# Patient Record
Sex: Male | Born: 1935 | Race: White | Hispanic: No | Marital: Single | Smoking: Current every day smoker
Health system: Southern US, Community
[De-identification: ages and names within clinical notes are randomized; demographics above are authoritative.]

## PROBLEM LIST (undated history)

## (undated) DIAGNOSIS — R339 Retention of urine, unspecified: Secondary | ICD-10-CM

## (undated) DIAGNOSIS — R7303 Prediabetes: Secondary | ICD-10-CM

## (undated) DIAGNOSIS — E785 Hyperlipidemia, unspecified: Secondary | ICD-10-CM

## (undated) DIAGNOSIS — I10 Essential (primary) hypertension: Secondary | ICD-10-CM

## (undated) DIAGNOSIS — K922 Gastrointestinal hemorrhage, unspecified: Secondary | ICD-10-CM

## (undated) DIAGNOSIS — E46 Unspecified protein-calorie malnutrition: Secondary | ICD-10-CM

## (undated) DIAGNOSIS — I4891 Unspecified atrial fibrillation: Secondary | ICD-10-CM

## (undated) DIAGNOSIS — K469 Unspecified abdominal hernia without obstruction or gangrene: Secondary | ICD-10-CM

## (undated) DIAGNOSIS — J449 Chronic obstructive pulmonary disease, unspecified: Secondary | ICD-10-CM

## (undated) DIAGNOSIS — C801 Malignant (primary) neoplasm, unspecified: Secondary | ICD-10-CM

## (undated) DIAGNOSIS — I639 Cerebral infarction, unspecified: Secondary | ICD-10-CM

## (undated) DIAGNOSIS — N400 Enlarged prostate without lower urinary tract symptoms: Secondary | ICD-10-CM

## (undated) DIAGNOSIS — I499 Cardiac arrhythmia, unspecified: Secondary | ICD-10-CM

## (undated) DIAGNOSIS — F419 Anxiety disorder, unspecified: Secondary | ICD-10-CM

## (undated) HISTORY — DX: Benign prostatic hyperplasia without lower urinary tract symptoms: N40.0

## (undated) HISTORY — PX: TONSILLECTOMY AND ADENOIDECTOMY: SHX28

## (undated) HISTORY — PX: NO PAST SURGERIES: SHX2092

## (undated) HISTORY — DX: Unspecified protein-calorie malnutrition: E46

## (undated) HISTORY — DX: Anxiety disorder, unspecified: F41.9

## (undated) HISTORY — DX: Retention of urine, unspecified: R33.9

## (undated) HISTORY — DX: Chronic obstructive pulmonary disease, unspecified: J44.9

## (undated) HISTORY — DX: Gastrointestinal hemorrhage, unspecified: K92.2

---

## 2009-09-16 ENCOUNTER — Emergency Department (HOSPITAL_COMMUNITY): Admission: EM | Admit: 2009-09-16 | Discharge: 2009-09-16 | Payer: Self-pay | Admitting: Emergency Medicine

## 2012-06-22 DIAGNOSIS — K922 Gastrointestinal hemorrhage, unspecified: Secondary | ICD-10-CM

## 2012-06-22 HISTORY — DX: Gastrointestinal hemorrhage, unspecified: K92.2

## 2012-06-30 ENCOUNTER — Encounter (HOSPITAL_COMMUNITY): Payer: Self-pay | Admitting: Emergency Medicine

## 2012-06-30 ENCOUNTER — Observation Stay (HOSPITAL_COMMUNITY)
Admission: EM | Admit: 2012-06-30 | Discharge: 2012-07-01 | Disposition: A | Discharge status: Home / Self Care | Payer: No Typology Code available for payment source | Attending: Internal Medicine | Admitting: Internal Medicine

## 2012-06-30 ENCOUNTER — Emergency Department (HOSPITAL_COMMUNITY): Payer: No Typology Code available for payment source

## 2012-06-30 DIAGNOSIS — I10 Essential (primary) hypertension: Secondary | ICD-10-CM

## 2012-06-30 DIAGNOSIS — F411 Generalized anxiety disorder: Secondary | ICD-10-CM

## 2012-06-30 DIAGNOSIS — R338 Other retention of urine: Secondary | ICD-10-CM

## 2012-06-30 DIAGNOSIS — F17200 Nicotine dependence, unspecified, uncomplicated: Secondary | ICD-10-CM | POA: Insufficient documentation

## 2012-06-30 DIAGNOSIS — E785 Hyperlipidemia, unspecified: Secondary | ICD-10-CM | POA: Insufficient documentation

## 2012-06-30 DIAGNOSIS — I4510 Unspecified right bundle-branch block: Secondary | ICD-10-CM | POA: Insufficient documentation

## 2012-06-30 DIAGNOSIS — R7309 Other abnormal glucose: Secondary | ICD-10-CM | POA: Insufficient documentation

## 2012-06-30 DIAGNOSIS — I4891 Unspecified atrial fibrillation: Principal | ICD-10-CM

## 2012-06-30 DIAGNOSIS — K4020 Bilateral inguinal hernia, without obstruction or gangrene, not specified as recurrent: Secondary | ICD-10-CM | POA: Insufficient documentation

## 2012-06-30 DIAGNOSIS — Z79899 Other long term (current) drug therapy: Secondary | ICD-10-CM | POA: Insufficient documentation

## 2012-06-30 DIAGNOSIS — F419 Anxiety disorder, unspecified: Secondary | ICD-10-CM | POA: Diagnosis present

## 2012-06-30 HISTORY — DX: Hyperlipidemia, unspecified: E78.5

## 2012-06-30 HISTORY — DX: Essential (primary) hypertension: I10

## 2012-06-30 HISTORY — DX: Unspecified abdominal hernia without obstruction or gangrene: K46.9

## 2012-06-30 HISTORY — DX: Prediabetes: R73.03

## 2012-06-30 LAB — CBC
MCH: 32 pg (ref 26.0–34.0)
Platelets: 149 10*3/uL — ABNORMAL LOW (ref 150–400)
RBC: 5.28 MIL/uL (ref 4.22–5.81)
RDW: 12.4 % (ref 11.5–15.5)
WBC: 9.5 10*3/uL (ref 4.0–10.5)

## 2012-06-30 LAB — URINALYSIS, ROUTINE W REFLEX MICROSCOPIC
Bilirubin Urine: NEGATIVE
Ketones, ur: NEGATIVE mg/dL
Leukocytes, UA: NEGATIVE
Nitrite: NEGATIVE
Urobilinogen, UA: 1 mg/dL (ref 0.0–1.0)
pH: 6 (ref 5.0–8.0)

## 2012-06-30 LAB — BASIC METABOLIC PANEL
Calcium: 9.7 mg/dL (ref 8.4–10.5)
Creatinine, Ser: 1.26 mg/dL (ref 0.50–1.35)
GFR calc non Af Amer: 52 mL/min — ABNORMAL LOW (ref >90)
Glucose, Bld: 122 mg/dL — ABNORMAL HIGH (ref 70–99)
Sodium: 140 mEq/L (ref 135–145)

## 2012-06-30 LAB — APTT: aPTT: 30 seconds (ref 24–37)

## 2012-06-30 MED ORDER — DILTIAZEM HCL 60 MG PO TABS
60.0000 mg | ORAL_TABLET | Freq: Once | ORAL | Status: AC
  Administered 2012-06-30: 60 mg via ORAL
  Filled 2012-06-30: qty 1

## 2012-06-30 MED ORDER — SIMVASTATIN 5 MG PO TABS: 5.0000 mg | ORAL_TABLET | Freq: Every day | ORAL | Status: DC

## 2012-06-30 MED ORDER — PRAVASTATIN SODIUM 10 MG PO TABS
10.0000 mg | ORAL_TABLET | Freq: Every day | ORAL | Status: DC
  Filled 2012-06-30: qty 1

## 2012-06-30 MED ORDER — NICOTINE 14 MG/24HR TD PT24
14.0000 mg | MEDICATED_PATCH | Freq: Every day | TRANSDERMAL | Status: DC
  Administered 2012-06-30 – 2012-07-01 (×2): 14 mg via TRANSDERMAL
  Filled 2012-06-30 (×2): qty 1

## 2012-06-30 MED ORDER — ACETAMINOPHEN 325 MG PO TABS: 650.0000 mg | ORAL_TABLET | Freq: Four times a day (QID) | ORAL | Status: DC | PRN

## 2012-06-30 MED ORDER — ONDANSETRON HCL 4 MG/2ML IJ SOLN
4.0000 mg | Freq: Once | INTRAMUSCULAR | Status: AC
  Administered 2012-06-30: 4 mg via INTRAVENOUS
  Filled 2012-06-30: qty 2

## 2012-06-30 MED ORDER — ALPRAZOLAM 0.5 MG PO TABS
0.5000 mg | ORAL_TABLET | Freq: Three times a day (TID) | ORAL | Status: DC | PRN
  Administered 2012-06-30 – 2012-07-01 (×2): 0.5 mg via ORAL
  Filled 2012-06-30 (×2): qty 1

## 2012-06-30 MED ORDER — DILTIAZEM LOAD VIA INFUSION
5.0000 mg | Freq: Once | INTRAVENOUS | Status: DC
  Filled 2012-06-30: qty 5

## 2012-06-30 MED ORDER — SODIUM CHLORIDE 0.9 % IV SOLN
INTRAVENOUS | Status: AC
  Administered 2012-06-30: 19:00:00 via INTRAVENOUS

## 2012-06-30 MED ORDER — RIVAROXABAN 15 MG PO TABS
15.0000 mg | ORAL_TABLET | Freq: Every day | ORAL | Status: DC
  Administered 2012-06-30: 15 mg via ORAL
  Filled 2012-06-30 (×2): qty 1

## 2012-06-30 MED ORDER — SIMVASTATIN 5 MG PO TABS
5.0000 mg | ORAL_TABLET | Freq: Every day | ORAL | Status: DC
  Administered 2012-06-30: 5 mg via ORAL
  Filled 2012-06-30 (×2): qty 1

## 2012-06-30 MED ORDER — SODIUM CHLORIDE 0.9 % IJ SOLN
3.0000 mL | Freq: Two times a day (BID) | INTRAMUSCULAR | Status: DC
  Administered 2012-06-30 – 2012-07-01 (×2): 3 mL via INTRAVENOUS

## 2012-06-30 MED ORDER — ALPRAZOLAM 1 MG PO TABS
1.0000 mg | ORAL_TABLET | Freq: Once | ORAL | Status: AC
  Administered 2012-06-30: 1 mg via ORAL
  Filled 2012-06-30: qty 1

## 2012-06-30 MED ORDER — ENOXAPARIN SODIUM 40 MG/0.4ML ~~LOC~~ SOLN
40.0000 mg | SUBCUTANEOUS | Status: DC
  Filled 2012-06-30: qty 0.4

## 2012-06-30 MED ORDER — DILTIAZEM HCL 30 MG PO TABS
30.0000 mg | ORAL_TABLET | Freq: Four times a day (QID) | ORAL | Status: DC
  Administered 2012-06-30: 30 mg via ORAL
  Filled 2012-06-30 (×7): qty 1

## 2012-06-30 MED ORDER — OXYCODONE HCL 5 MG PO TABS
5.0000 mg | ORAL_TABLET | ORAL | Status: DC | PRN
  Administered 2012-06-30: 5 mg via ORAL
  Filled 2012-06-30: qty 1

## 2012-06-30 MED ORDER — DILTIAZEM HCL 25 MG/5ML IV SOLN: 5.0000 mg | Freq: Once | INTRAVENOUS | Status: DC

## 2012-06-30 MED ORDER — ONDANSETRON HCL 4 MG PO TABS: 4.0000 mg | ORAL_TABLET | Freq: Four times a day (QID) | ORAL | Status: DC | PRN

## 2012-06-30 MED ORDER — ONDANSETRON HCL 4 MG/2ML IJ SOLN
4.0000 mg | Freq: Four times a day (QID) | INTRAMUSCULAR | Status: DC | PRN
  Administered 2012-06-30 – 2012-07-01 (×2): 4 mg via INTRAVENOUS
  Filled 2012-06-30 (×2): qty 2

## 2012-06-30 MED ORDER — DEXTROSE 5 % IV SOLN
5.0000 mg/h | INTRAVENOUS | Status: DC
  Filled 2012-06-30: qty 100

## 2012-06-30 MED ORDER — ACETAMINOPHEN 650 MG RE SUPP: 650.0000 mg | Freq: Four times a day (QID) | RECTAL | Status: DC | PRN

## 2012-06-30 NOTE — Progress Notes (Signed)
   CARE MANAGEMENT ED NOTE 06/30/2012  Patient:  ZION, TA   Account Number:  [id number]  Date Initiated:  06/30/2012  Documentation initiated by:    Subjective/Objective Assessment:   Patient presented to ED with difficulty urinating     Subjective/Objective Assessment Detail:     Action/Plan:   Action/Plan Detail:   Anticipated DC Date:       Status Recommendation to Physician:   Result of Recommendation:    Other ED Services  Consult Working Plan    DC Planning Services  Other  PCP issues    Choice offered to / List presented to:            Status of service:  Completed, signed off  ED Comments:   ED Comments Detail:  Patient listed as not having a PCP.  EDCM went to speak with patient.  Physician in with patient at this time.  As per patient's RN, poatient's PCP is Dr. Catha Gosselin of [facility].  No further needs at this time.

## 2012-06-30 NOTE — Consult Note (Signed)
CARDIOLOGY CONSULT NOTE  Patient ID: Shannon Kirby, MRN: [id number], DOB/AGE: April 20, 1931 79 y.o. Admit date: 06/30/2012   Date of Consult: 06/30/2012 Primary Physician: Mickie Hillier, MD Primary Cardiologist: New to [facility], being seen by Dr. Patty Sermons  Chief Complaint: can't urinate Reason for Consult: newly recognized atrial fibrillation  HPI: Shannon Kirby is a 79 y/o M with no prior cardiac history but a history of HTN, longstanding tobacco abuse, and pre-diabetes who presented to [facility] with complaints of symptoms of urinary retention for the last 3-4 days. He has been having trouble with slow stream, difficulty initiating stream, and dribbling. He denies dysuria or hematuria. This morning he had abdominal fullness and nausea thus came to the ER. Foley was inserted with -700 straw colored urine. He also reported some lightheadedness that improved with laying down, thus was placed on the monitor which showed afib 95-105. He received 60mg  oral diltiazem with improvement in HR to the 80's. He denies any awareness of his arrhythmia including CP, SOB or palpitations. No syncope or history of falls/unsteadiness. He denies a history of stroke, mini-stroke, or prior bleeding problems. He has lost about 3 lbs in several months unintentionally. He currently denies any symptoms at all.  Past Medical History  Diagnosis Date  . Hypertension   . Hernia     L groin   . Hyperlipidemia   . Pre-diabetes       Most Recent Cardiac Studies: none   Surgical History:  Past Surgical History  Procedure Laterality Date  . No past surgeries       Home Meds: Prior to Admission medications   Medication Sig Start Date End Date Taking? Authorizing Provider  ALPRAZolam Prudy Feeler) 1 MG tablet Take 0.5 mg by mouth 4 (four) times daily as needed for sleep.   Yes Historical Provider, MD  amLODipine (NORVASC) 10 MG tablet Take 10 mg by mouth every morning.   Yes Historical Provider, MD  metoprolol succinate (TOPROL-XL) 25 MG 24 hr  tablet Take 25 mg by mouth every morning.   Yes Historical Provider, MD  pravastatin (PRAVACHOL) 10 MG tablet Take 10 mg by mouth every evening.   Yes Historical Provider, MD    Inpatient Medications:  . diltiazem  30 mg Oral Q6H  . enoxaparin (LOVENOX) injection  40 mg Subcutaneous Q24H  . nicotine  14 mg Transdermal Daily  . simvastatin  5 mg Oral q1800  . sodium chloride  3 mL Intravenous Q12H   . sodium chloride      Allergies: No Known Allergies  History   Social History  . Marital Status: Single    Spouse Name: N/A    Number of Children: N/A  . Years of Education: N/A   Occupational History  . Not on file.   Social History Main Topics  . Smoking status: Current Every Day Smoker -- 1.00 packs/day for 63 years  . Smokeless tobacco: Never Used     Comment: Smoked since age 79  . Alcohol Use: No  . Drug Use: No  . Sexually Active: Not on file   Other Topics Concern  . Not on file   Social History Narrative  . No narrative on file     Family History  Problem Relation Age of Onset  . Other Mother     Passed at 8 of "blood clot in chest"  . Colon cancer Father     Passed at 28  . Heart attack Sister     Passed away 28  Review of Systems: General: negative for chills, fever, night sweats Cardiovascular: negative for edema, orthopnea, palpitations, paroxysmal nocturnal dyspnea, shortness of breath or dyspnea on exertion Dermatological: negative for rash Respiratory: negative for cough or wheezing Urologic: negative for hematuria Abdominal: negative for nausea, vomiting, diarrhea, bright red blood per rectum, melena, or hematemesis Neurologic: negative for visual changes, syncope All other systems reviewed and are otherwise negative except as noted above.  Labs: Troponin neg x 1 POC Lab Results  Component Value Date   WBC 9.5 06/30/2012   HGB 16.9 06/30/2012   HCT 48.8 06/30/2012   MCV 92.4 06/30/2012   PLT 149* 06/30/2012    Recent Labs Lab  06/30/12 1037  NA 140  K 4.9  CL 102  CO2 29  BUN 14  CREATININE 1.26  CALCIUM 9.7  GLUCOSE 122*    Radiology/Studies:  Dg Chest Port 1 View 06/30/2012   *RADIOLOGY REPORT*  Clinical Data: Dysuria, difficulty urinating, dizziness, history asthma, hypertension  PORTABLE CHEST - 1 VIEW  Comparison: Portable exam 1025 hours compared to 11/09/2011  Findings: Normal heart size, mediastinal contours, and pulmonary vascularity. Skin folds project over chest bilaterally. Emphysematous and bronchitic changes consistent with COPD. Minimal atelectasis at right base. No acute infiltrate, pleural effusion or pneumothorax. Bones appear demineralized.  IMPRESSION: COPD changes with minimal right basilar atelectasis. No acute infiltrate.   Original Report Authenticated By: Ulyses Southward, M.D.    EKG: atrial fibrillation 104bpm atypical RBBBB, nonspecific ST-T changes  Physical Exam: Blood pressure 122/79, pulse 90, temperature 97 F (36.1 C), temperature source Oral, resp. rate 19, height 6\' 3"  (1.905 m), weight 133 lb (60.328 kg), SpO2 98.00%. General: Well developed, well nourished thin WM in no acute distress. Head: Normocephalic, atraumatic, sclera non-icteric, no xanthomas, nares are without discharge.  Neck: Negative for carotid bruits. JVD not elevated. Lungs: Clear bilaterally to auscultation without wheezes, rales, or rhonchi. Breathing is unlabored. Heart: Irregularly irregular, controlled rate, with S1 S2. No murmurs, rubs, or gallops appreciated. Abdomen: Soft, non-tender, non-distended with normoactive bowel sounds. No hepatomegaly. No rebound/guarding. No obvious abdominal masses. Msk:  Strength and tone appear normal for age. Extremities: No clubbing or cyanosis. No edema.  Distal pedal pulses are 2+ and equal bilaterally. Neuro: Alert and oriented X 3. No facial asymmetry. No focal deficit. Moves all extremities spontaneously. Psych:  Responds to questions appropriately with a normal affect.    Assessment and Plan:  1. Urinary retention - per IM. PSA is also pending (has noted 3lb weight loss). 2. Newly recognized atrial fibillation - duration unknown. He is relatively asymptomatic. CHA2DS2VASC preliminarily is 3 for age/HTN, but may have +1 for diabetes if A1C is abnormal. There are no contraindications to anticoagulation identified at this time. We will start Xarelto 15mg  qsupper (CrCl 44ml/min). If renal function improves, may need to adjust dose up to 20mg . Agree with 2D echo, TSH, diltiazem 30mg  po q6hr as ordered. 3. Renal insufficiency - follow Cr s/p foley.  4. HTN - follow BP. 5. Hyperglycemia -check A1C. 6. Tobacco abuse - counseled regarding cessation. He is not interested in quitting at this time.  Signed, Ronie Spies PA-C 06/30/2012, 4:17 PM Patient seen with Ronie Spies, PA-C. He has been totally unaware of his heart rhythm.  He has had occasional mild dizziness in the last several weeks which may or may not be related to his atrial fibrillation. Denies any chest pain.  No TIA or stroke symptoms. Has smoked since age 10. His exam is consistent  with COPD with barrel chest and distant breath sounds. Heart reveals distant heart tones with no murmur noted. 2D echo is pending. I talked with him about long term anticoagulation to prevent systemic emboli and he and his family are agreeable. No contra-indication to anticoagulation.  Agree with assessment and plan above.

## 2012-06-30 NOTE — ED Notes (Signed)
Pt states difficulty urinating since Friday, states feels he needs to go but unable, states he doesn't know his prostate has ever been checked, also c/o dizziness, feels he is going to pass out, EMS states heart rate irregular, pt on monitor shows A fib, pt never told he has had irregular heart rhythm,

## 2012-06-30 NOTE — ED Provider Notes (Signed)
History     CSN: [id number]  Arrival date & time 06/30/12  4098   First MD Initiated Contact with Patient 06/30/12 1010      Chief Complaint  Patient presents with  . Dysuria    (Consider location/radiation/quality/duration/timing/severity/associated sxs/prior treatment) The history is provided by the spouse.    Patient reports he has been having trouble starting to urinate for the past 2 weeks. He denies abdominal pain, vomiting although he did have nausea this morning. He states he has a normal appetite and has not had fever. He states he has never had this problem before and has never had to see a urologist. He denies feeling constipated. He denies any blood in his urine. He states he just feels a need to urinate and feels like he can't.  PCP Dr Wyline Beady has an appt tomorrow for not being able to urinate  Past Medical History  Diagnosis Date  . Hypertension   . Hernia     L groin   . Hyperlipidemia   . Pre-diabetes     Past Surgical History  Procedure Laterality Date  . No past surgeries      Family History  Problem Relation Age of Onset  . Other Mother     Passed at 48 of "blood clot in chest"  . Colon cancer Father     Passed at 2  . Heart attack Sister     Passed away 33    History  Substance Use Topics  . Smoking status: Yes 1/2-3/4 ppd  . Smokeless tobacco: Not on file  . Alcohol Use: No   Lives at home Lives with daughter   Review of Systems  All other systems reviewed and are negative.    Allergies  Review of patient's allergies indicates no known allergies.  Home Medications    Details  ALPRAZolam (XANAX) 1 MG tablet Take 0.5 mg by mouth 4 (four) times daily as needed for sleep.    amLODipine (NORVASC) 10 MG tablet Take 10 mg by mouth every morning.    metoprolol succinate (TOPROL-XL) 25 MG 24 hr tablet Take 25 mg by mouth every morning.    pravastatin (PRAVACHOL) 10 MG tablet Take 10 mg by mouth every evening.        BP 106/68   Pulse 124  Temp(Src) 98.1 F (36.7 C)  SpO2 98%  Vital signs normal    Physical Exam  Nursing note and vitals reviewed. Constitutional: He is oriented to person, place, and time. He appears well-developed and well-nourished.  Non-toxic appearance. He does not appear ill. No distress.  HENT:  Head: Normocephalic and atraumatic.  Right Ear: External ear normal.  Left Ear: External ear normal.  Nose: Nose normal. No mucosal edema or rhinorrhea.  Mouth/Throat: Oropharynx is clear and moist and mucous membranes are normal. No dental abscesses or edematous.  Eyes: Conjunctivae and EOM are normal. Pupils are equal, round, and reactive to light.  Neck: Normal range of motion and full passive range of motion without pain. Neck supple.  Cardiovascular: Normal heart sounds.  An irregular rhythm present. Tachycardia present.  Exam reveals no gallop and no friction rub.   No murmur heard. Pulmonary/Chest: Effort normal and breath sounds normal. No respiratory distress. He has no wheezes. He has no rhonchi. He has no rales. He exhibits no tenderness and no crepitus.  Abdominal: Soft. Normal appearance and bowel sounds are normal. He exhibits no distension. There is no tenderness. There is no rebound and no  guarding.  Mild suprapubic discomfort  Musculoskeletal: Normal range of motion. He exhibits no edema and no tenderness.  Moves all extremities well.   Neurological: He is alert and oriented to person, place, and time. He has normal strength. No cranial nerve deficit.  Skin: Skin is warm, dry and intact. No rash noted. No erythema. No pallor.  Psychiatric: He has a normal mood and affect. His speech is normal and behavior is normal. His mood appears not anxious.    ED Course  Procedures (including critical care time)  Medications  ALPRAZolam (XANAX) tablet 0.5 mg (not administered)  diltiazem (CARDIZEM) tablet 30 mg (not administered)  enoxaparin (LOVENOX) injection 40 mg (not administered)   sodium chloride 0.9 % injection 3 mL (not administered)  0.9 %  sodium chloride infusion (not administered)  acetaminophen (TYLENOL) tablet 650 mg (not administered)    Or  acetaminophen (TYLENOL) suppository 650 mg (not administered)  oxyCODONE (Oxy IR/ROXICODONE) immediate release tablet 5 mg (not administered)  ondansetron (ZOFRAN) tablet 4 mg (not administered)    Or  ondansetron (ZOFRAN) injection 4 mg (not administered)  nicotine (NICODERM CQ - dosed in mg/24 hours) patch 14 mg (not administered)  simvastatin (ZOCOR) tablet 5 mg (not administered)  diltiazem (CARDIZEM) tablet 60 mg (60 mg Oral Given 06/30/12 1424)  ondansetron (ZOFRAN) injection 4 mg (4 mg Intravenous Given 06/30/12 1423)  Cardizem bolus and drip ordered, cancelled by Dr Rito Ehrlich Heparin bolus and drip also ordered and cancelled by Dr Rito Ehrlich  Bladder ultrasound showed over 200 cc of urine in the bladder. Foley catheter was inserted and patient had over 700 cc of urine with improvement of his symptoms.  Patient was noted to have irregular heartbeat and was placed on a monitor. He was noted to be in atrial fibrillation. Patient denies chest pain, shortness of breath, or palpitations. He states he's never seen a cardiologist. We discussed admission for further evaluation and Cardizem and heparin was ordered.  13:39 PM Dr Barnie Del, will see patient in ED and decide if he needs admission   Pt admitted with oral medications.    Results for orders placed during the hospital encounter of 06/30/12  BASIC METABOLIC PANEL      Result Value Range   Sodium 140  135 - 145 mEq/L   Potassium 4.9  3.5 - 5.1 mEq/L   Chloride 102  96 - 112 mEq/L   CO2 29  19 - 32 mEq/L   Glucose, Bld 122 (*) 70 - 99 mg/dL   BUN 14  6 - 23 mg/dL   Creatinine, Ser 6.21  0.50 - 1.35 mg/dL   Calcium 9.7  8.4 - 30.8 mg/dL   GFR calc non Af Amer 52 (*) >90 mL/min   GFR calc Af Amer 61 (*) >90 mL/min  CBC      Result Value Range   WBC 9.5  4.0  - 10.5 K/uL   RBC 5.28  4.22 - 5.81 MIL/uL   Hemoglobin 16.9  13.0 - 17.0 g/dL   HCT 65.7  84.6 - 96.2 %   MCV 92.4  78.0 - 100.0 fL   MCH 32.0  26.0 - 34.0 pg   MCHC 34.6  30.0 - 36.0 g/dL   RDW 95.2  84.1 - 32.4 %   Platelets 149 (*) 150 - 400 K/uL  URINALYSIS, ROUTINE W REFLEX MICROSCOPIC      Result Value Range   Color, Urine YELLOW  YELLOW   APPearance CLOUDY (*) CLEAR  Specific Gravity, Urine 1.018  1.005 - 1.030   pH 6.0  5.0 - 8.0   Glucose, UA NEGATIVE  NEGATIVE mg/dL   Hgb urine dipstick NEGATIVE  NEGATIVE   Bilirubin Urine NEGATIVE  NEGATIVE   Ketones, ur NEGATIVE  NEGATIVE mg/dL   Protein, ur NEGATIVE  NEGATIVE mg/dL   Urobilinogen, UA 1.0  0.0 - 1.0 mg/dL   Nitrite NEGATIVE  NEGATIVE   Leukocytes, UA NEGATIVE  NEGATIVE  APTT      Result Value Range   aPTT 30  24 - 37 seconds  PROTIME-INR      Result Value Range   Prothrombin Time 14.0  11.6 - 15.2 seconds   INR 1.09  0.00 - 1.49  POCT I-STAT TROPONIN I      Result Value Range   Troponin i, poc 0.00  0.00 - 0.08 ng/mL   Comment 3            Laboratory interpretation all normal   Dg Chest Port 1 View  06/30/2012   *RADIOLOGY REPORT*  Clinical Data: Dysuria, difficulty urinating, dizziness, history asthma, hypertension  PORTABLE CHEST - 1 VIEW  Comparison: Portable exam 1025 hours compared to 11/09/2011  Findings: Normal heart size, mediastinal contours, and pulmonary vascularity. Skin folds project over chest bilaterally. Emphysematous and bronchitic changes consistent with COPD. Minimal atelectasis at right base. No acute infiltrate, pleural effusion or pneumothorax. Bones appear demineralized.  IMPRESSION: COPD changes with minimal right basilar atelectasis. No acute infiltrate.   Original Report Authenticated By: Ulyses Southward, M.D.     Date: 06/30/2012  Rate: 104  Rhythm: atrial fibrillation  QRS Axis: normal  Intervals: normal  ST/T Wave abnormalities: normal  Conduction Disutrbances:nonspecific  intraventricular conduction delay  Narrative Interpretation: PRWP  Old EKG Reviewed: none available      1. Acute urinary retention   2. Atrial fibrillation with rapid ventricular response   3. Atrial fibrillation, new onset   4. Anxiety   5. HTN (hypertension), benign     Plan admitted   Devoria Albe, MD, FACEP   MDM  patient with atrial fibrillation of unknown time of onset. He is currently asymptomatic. Patient is at risk for having a stroke if he converts without anticoagulation. He is being admitted for further evaluation.           Ward Givens, MD 06/30/12 1616

## 2012-06-30 NOTE — Progress Notes (Signed)
Dr. Adolm Joseph notified of pts heart rate dropping into 30's, sustaining in the 50's.  Pt is asymptomatic, will hold cardizem, will call if sustains in 30's or if goes into junctional rhythmn.  Barnett Hatter P

## 2012-06-30 NOTE — ED Notes (Signed)
Bed:WA08<BR> Expected date:<BR> Expected time:<BR> Means of arrival:<BR> Comments:<BR>

## 2012-06-30 NOTE — ED Notes (Signed)
Bladder scan had >200 mls of urine. Dr Lynelle Doctor made aware. Verbal order given to insert foley catheter.

## 2012-06-30 NOTE — ED Notes (Signed)
Pt states he feels much better after insertion of foley catheter. States " I feel like I can go home now."

## 2012-06-30 NOTE — H&P (Signed)
[facility] History and Physical  Shannon Kirby RUE:[id number] DOB: 1931/11/15 DOA: 06/30/2012   PCP: Mickie Hillier, MD  Specialists: None  Chief Complaint: Difficulty urinating  HPI: Shannon Kirby is a 79 y.o. male with a past medical history of anxiety, hypertension, who was in his usual state of health till about 2-3 days ago, when he started noticing that he was having difficulty passing urine. It would come out in a very slow stream and dribble. Denies any blood in the urine. Denies any pain with urination, per se. No fever or chills. No nausea, vomiting at home. He denies any abdominal pain. Denies any chest pain, shortness of breath. He did have some dizziness and lightheadedness, earlier, however, denies any such symptoms currently. He hasn't seen an urologist in the past. He doesn't know of any prostate problems in the past. He denies any history of atrial fibrillation or any other heart disease in the past. He denies any palpitations.  Home Medications: Prior to Admission medications   Medication Sig Start Date End Date Taking? Authorizing Provider  ALPRAZolam Prudy Feeler) 1 MG tablet Take 0.5 mg by mouth 4 (four) times daily as needed for sleep.   Yes Historical Provider, MD  amLODipine (NORVASC) 10 MG tablet Take 10 mg by mouth every morning.   Yes Historical Provider, MD  metoprolol succinate (TOPROL-XL) 25 MG 24 hr tablet Take 25 mg by mouth every morning.   Yes Historical Provider, MD  pravastatin (PRAVACHOL) 10 MG tablet Take 10 mg by mouth every evening.   Yes Historical Provider, MD    Allergies: No Known Allergies  Past Medical History: Past Medical History  Diagnosis Date  . Hypertension     Denies any surgeries in the past  Social History:  reports that he has been smoking.  He does not have any smokeless tobacco history on file. He reports that he does not drink alcohol or use illicit drugs. smokes a half a pack of cigarettes on a daily basis  Living Situation: His  daughter lives with him in [place] Activity Level:  Independent with daily activities   Family History: Denies any health problems in the family  Review of Systems - History obtained from the patient General ROS: negative Psychological ROS: negative Ophthalmic ROS: negative ENT ROS: negative Allergy and Immunology ROS: negative Hematological and Lymphatic ROS: negative Endocrine ROS: negative Respiratory ROS: no cough, shortness of breath, or wheezing Cardiovascular ROS: no chest pain or dyspnea on exertion Gastrointestinal ROS: no abdominal pain, change in bowel habits, or black or bloody stools Genito-Urinary ROS: as in hpi Musculoskeletal ROS: negative Neurological ROS: no TIA or stroke symptoms Dermatological ROS: negative  Physical Examination  Filed Vitals:   06/30/12 0955 06/30/12 1132 06/30/12 1133 06/30/12 1135  BP: 119/75 118/75 103/67 106/68  Pulse:  98 101 124  Temp: 98.1 F (36.7 C)     SpO2: 98%       General appearance: alert, cooperative, appears stated age and no distress Head: Normocephalic, without obvious abnormality, atraumatic Eyes: conjunctivae/corneas clear. PERRL, EOM's intact.  Throat: lips, mucosa, and tongue normal; teeth and gums normal Neck: no adenopathy, no carotid bruit, no JVD, supple, symmetrical, trachea midline and thyroid not enlarged, symmetric, no tenderness/mass/nodules Back: symmetric, no curvature. ROM normal. No CVA tenderness. Resp: clear to auscultation bilaterally Cardio: S1 and S2 is irregularly irregular. No S3, S4. No rubs, murmurs, bruit. No pedal edema. No JVD. GI: soft, non-tender; bowel sounds normal; no masses,  no organomegaly Male  genitalia: Bilateral inguinal hernias noted. Nontender and reducible. Extremities: extremities normal, atraumatic, no cyanosis or edema Pulses: 2+ and symmetric Skin: Skin color, texture, turgor normal. No rashes or lesions Lymph nodes: Cervical, supraclavicular, and axillary nodes  normal. Neurologic: He is alert and oriented x3. No focal neurological deficits are noted.  Laboratory Data: Results for orders placed during the hospital encounter of 06/30/12 (from the past 48 hour(s))  URINALYSIS, ROUTINE W REFLEX MICROSCOPIC     Status: Abnormal   Collection Time    06/30/12 10:09 AM      Result Value Range   Color, Urine YELLOW  YELLOW   APPearance CLOUDY (*) CLEAR   Specific Gravity, Urine 1.018  1.005 - 1.030   pH 6.0  5.0 - 8.0   Glucose, UA NEGATIVE  NEGATIVE mg/dL   Hgb urine dipstick NEGATIVE  NEGATIVE   Bilirubin Urine NEGATIVE  NEGATIVE   Ketones, ur NEGATIVE  NEGATIVE mg/dL   Protein, ur NEGATIVE  NEGATIVE mg/dL   Urobilinogen, UA 1.0  0.0 - 1.0 mg/dL   Nitrite NEGATIVE  NEGATIVE   Leukocytes, UA NEGATIVE  NEGATIVE   Comment: MICROSCOPIC NOT DONE ON URINES WITH NEGATIVE PROTEIN, BLOOD, LEUKOCYTES, NITRITE, OR GLUCOSE <1000 mg/dL.  BASIC METABOLIC PANEL     Status: Abnormal   Collection Time    06/30/12 10:37 AM      Result Value Range   Sodium 140  135 - 145 mEq/L   Potassium 4.9  3.5 - 5.1 mEq/L   Chloride 102  96 - 112 mEq/L   CO2 29  19 - 32 mEq/L   Glucose, Bld 122 (*) 70 - 99 mg/dL   BUN 14  6 - 23 mg/dL   Creatinine, Ser 1.47  0.50 - 1.35 mg/dL   Calcium 9.7  8.4 - 82.9 mg/dL   GFR calc non Af Amer 52 (*) >90 mL/min   GFR calc Af Amer 61 (*) >90 mL/min   Comment:            The eGFR has been calculated     using the CKD EPI equation.     This calculation has not been     validated in all clinical     situations.     eGFR's persistently     <90 mL/min signify     possible Chronic Kidney Disease.  CBC     Status: Abnormal   Collection Time    06/30/12 10:37 AM      Result Value Range   WBC 9.5  4.0 - 10.5 K/uL   RBC 5.28  4.22 - 5.81 MIL/uL   Hemoglobin 16.9  13.0 - 17.0 g/dL   HCT 56.2  13.0 - 86.5 %   MCV 92.4  78.0 - 100.0 fL   MCH 32.0  26.0 - 34.0 pg   MCHC 34.6  30.0 - 36.0 g/dL   RDW 78.4  69.6 - 29.5 %   Platelets 149  (*) 150 - 400 K/uL  POCT I-STAT TROPONIN I     Status: None   Collection Time    06/30/12 10:39 AM      Result Value Range   Troponin i, poc 0.00  0.00 - 0.08 ng/mL   Comment 3            Comment: Due to the release kinetics of cTnI,     a negative result within the first hours     of the onset of symptoms does not rule out  myocardial infarction with certainty.     If myocardial infarction is still suspected,     repeat the test at appropriate intervals.  APTT     Status: None   Collection Time    06/30/12  1:41 PM      Result Value Range   aPTT 30  24 - 37 seconds  PROTIME-INR     Status: None   Collection Time    06/30/12  1:41 PM      Result Value Range   Prothrombin Time 14.0  11.6 - 15.2 seconds   INR 1.09  0.00 - 1.49    Radiology Reports: Dg Chest Port 1 View  06/30/2012   *RADIOLOGY REPORT*  Clinical Data: Dysuria, difficulty urinating, dizziness, history asthma, hypertension  PORTABLE CHEST - 1 VIEW  Comparison: Portable exam 1025 hours compared to 11/09/2011  Findings: Normal heart size, mediastinal contours, and pulmonary vascularity. Skin folds project over chest bilaterally. Emphysematous and bronchitic changes consistent with COPD. Minimal atelectasis at right base. No acute infiltrate, pleural effusion or pneumothorax. Bones appear demineralized.  IMPRESSION: COPD changes with minimal right basilar atelectasis. No acute infiltrate.   Original Report Authenticated By: Ulyses Southward, M.D.    Electrocardiogram: His EKG shows atrial fibrillation at 104bpm. There is evidence for right bundle branch block. Nonspecific T wave changes noted as well. No definite Q waves.  Problem List  Principal Problem:   Atrial fibrillation, new onset Active Problems:   Acute urinary retention   HTN (hypertension), benign   Anxiety   Assessment: This is a 79 year old, Caucasian male, with a past medical history of hypertension, presents with difficult urination. He was found to be  in urinary retention based on a bladder scan. Foley catheter was placed and 700 mL of straw-colored urine came out. Because he was complaining of dizziness he was placed on monitor and was found to be in atrial fibrillation.  Plan: #1 new onset atrial fibrillation: It is unclear as to how long he has had this rhythm. It's conceivable that this problem started with the acute onset of urinary retention. However, it is also likely that he has had this rhythm for a long time. ED physician had initiated intravenous diltiazem. However, his heart rate is in the 90s to 105. I think heart rate can be controlled with oral medications at this time. We will initiate Cardizem orally. We will get echocardiogram. Will get TSH level. He'll be monitored on telemetry overnight. He will eventually need ischemic workup, but that that can be pursued as an outpatient. He has a CHADS2 score of 2. Cardiology has been consulted. Defer anticoagulation to them at this time.   #2 acute urinary retention: Foley has been placed and the patient is experiencing relief at this time. PSA level will be checked. He will need outpatient urology followup.  #3 hypertension: Blood pressure is reasonably well controlled. We will hold his amlodipine now that he is on Cardizem for heart rate control. He did take his metoprolol XL in the morning.   #5 anxiety: Continue with Xanax as needed  #6 Bilateral inguinal Hernia: Patient states that this has been present for a long time. He has seen a surgeon in the past and was not thought to be a operative candidate.   #7 tobacco abuse: Nicotine patch will be prescribed.  Monitor platelet counts closely.  DVT Prophylaxis: Enoxaparin Code Status: He is a full code Family Communication: Discuss with the patient's daughter and the patient.  Disposition Plan: Admit  to telemetry for now.   Further management decisions will depend on results of further testing and patient's response to  treatment.  [facility]  [facility] Pager [phone]  If 7PM-7AM, please contact night-coverage [email or web address] Password [email or web address]  06/30/2012, 2:14 PM

## 2012-06-30 NOTE — ED Notes (Signed)
Pt states for 3 days now states that he has had some urine this am but not much. Nausea and some dizzyness, alert x4,

## 2012-07-01 DIAGNOSIS — I369 Nonrheumatic tricuspid valve disorder, unspecified: Secondary | ICD-10-CM

## 2012-07-01 LAB — COMPREHENSIVE METABOLIC PANEL
ALT: 10 U/L (ref 0–53)
Calcium: 8.7 mg/dL (ref 8.4–10.5)
GFR calc Af Amer: 68 mL/min — ABNORMAL LOW (ref >90)
Glucose, Bld: 92 mg/dL (ref 70–99)
Sodium: 136 mEq/L (ref 135–145)
Total Protein: 5.8 g/dL — ABNORMAL LOW (ref 6.0–8.3)

## 2012-07-01 LAB — CBC
HCT: 42.8 % (ref 39.0–52.0)
Hemoglobin: 14.6 g/dL (ref 13.0–17.0)
MCHC: 34.1 g/dL (ref 30.0–36.0)
WBC: 11.1 10*3/uL — ABNORMAL HIGH (ref 4.0–10.5)

## 2012-07-01 LAB — PSA: PSA: 3.14 ng/mL (ref <=4.00)

## 2012-07-01 LAB — HEMOGLOBIN A1C: Hgb A1c MFr Bld: 5.8 % — ABNORMAL HIGH (ref <5.7)

## 2012-07-01 MED ORDER — DILTIAZEM HCL ER COATED BEADS 120 MG PO CP24: 120.0000 mg | ORAL_CAPSULE | Freq: Every day | ORAL | Status: AC

## 2012-07-01 MED ORDER — OFF THE BEAT BOOK
Freq: Once | Status: DC
  Filled 2012-07-01: qty 1

## 2012-07-01 MED ORDER — ONDANSETRON 4 MG PO TBDP: 4.0000 mg | ORAL_TABLET | Freq: Three times a day (TID) | ORAL | Status: DC | PRN

## 2012-07-01 MED ORDER — DILTIAZEM HCL ER COATED BEADS 120 MG PO CP24
120.0000 mg | ORAL_CAPSULE | Freq: Every day | ORAL | Status: DC
  Administered 2012-07-01: 120 mg via ORAL
  Filled 2012-07-01 (×2): qty 1

## 2012-07-01 MED ORDER — RIVAROXABAN 20 MG PO TABS: 20.0000 mg | ORAL_TABLET | Freq: Every day | ORAL | Status: DC

## 2012-07-01 NOTE — Discharge Summary (Signed)
[facility]  Physician Discharge Summary   Patient ID: Shannon Kirby MRN: [id number] DOB/AGE: February 16, 1931 79 y.o.  Admit date: 06/30/2012 Discharge date: 07/01/2012  PCP: Mickie Hillier, MD  DISCHARGE DIAGNOSES:  Principal Problem:   Atrial fibrillation, new onset Active Problems:   Acute urinary retention   HTN (hypertension), benign   Anxiety   RECOMMENDATIONS FOR OUTPATIENT FOLLOW UP: 1. ECHO report is pending. 2. Patient provided ph number for urology for urinary retention.  DISCHARGE CONDITION: fair  Diet recommendation: Heart Healthy  Filed Weights   06/30/12 1532  Weight: 60.328 kg (133 lb)    INITIAL HISTORY: Shannon Kirby is a 79 y.o. male with a past medical history of anxiety, hypertension, who was in his usual state of health till about 2-3 days prior to admission when he started noticing that he was having difficulty passing urine. It would come out in a very slow stream and dribble. Denied any blood in the urine. Denied any pain with urination, per se. No fever or chills. No nausea, vomiting at home. He denied any abdominal pain. Denied any chest pain, shortness of breath. He did have some dizziness and lightheadedness, earlier, however, denies any such symptoms currently. He hasn't seen an urologist in the past. He doesn't know of any prostate problems in the past. He denies any history of atrial fibrillation or any other heart disease in the past. He denied any palpitations.  Consultations:  LB Cards  Procedures:  Foley was placed  ECHO done. Report is pending.  HOSPITAL COURSE:   New onset atrial fibrillation It is unclear as to how long he has had this rhythm. Patient was seen by cardiology and started on Xarelto and Cardizem. He remains asymptomatic. ECHo was done and report will be followed up by the cardiologist. His HR is slightly high this morning as he hasn't received his medications yet. His CHADS2 score was 2. TSH was checked and was  normal.  Acute urinary retention Relieved with foley. He had output of immediately after the foley was placed. PSA is 3.14. Will provide contact for Urology. He will need to see them this week and leave foley in till then.   Hypertension Blood pressure is reasonably well controlled. Will stop Amlodipine and Metoprolol now that he is on Cardizem.   Anxiety Continue with Xanax as needed   Bilateral inguinal Hernia Patient states that this has been present for a long time. He has seen a surgeon in the past and was not thought to be a operative candidate. The hernia is reducible and is non tender.  Overall patient is stable. He is complaining of slight nausea this morning. No vomiting. No abdominal pain. He will be given zofran and monitored till after lunch. His HR is slightly high at 90-115. He will given his AM meds. If he remains stable after lunch he can be discharged. He has been cleared by cardiology.   PERTINENT LABS:  The results of significant diagnostics from this hospitalization (including imaging, microbiology, ancillary and laboratory) are listed below for reference.     Labs: Basic Metabolic Panel:  Recent Labs Lab 06/30/12 1037 07/01/12 0453  NA 140 136  K 4.9 4.1  CL 102 102  CO2 29 27  GLUCOSE 122* 92  BUN 14 14  CREATININE 1.26 1.15  CALCIUM 9.7 8.7   Liver Function Tests:  Recent Labs Lab 07/01/12 0453  AST 12  ALT 10  ALKPHOS 62  BILITOT 0.7  PROT 5.8*  ALBUMIN 3.3*   CBC:  Recent Labs Lab 06/30/12 1037 07/01/12 0453  WBC 9.5 11.1*  HGB 16.9 14.6  HCT 48.8 42.8  MCV 92.4 92.4  PLT 149* 137*   IMAGING STUDIES Dg Chest Port 1 View  06/30/2012   *RADIOLOGY REPORT*  Clinical Data: Dysuria, difficulty urinating, dizziness, history asthma, hypertension  PORTABLE CHEST - 1 VIEW  Comparison: Portable exam 1025 hours compared to 11/09/2011  Findings: Normal heart size, mediastinal contours, and pulmonary vascularity. Skin folds project over  chest bilaterally. Emphysematous and bronchitic changes consistent with COPD. Minimal atelectasis at right base. No acute infiltrate, pleural effusion or pneumothorax. Bones appear demineralized.  IMPRESSION: COPD changes with minimal right basilar atelectasis. No acute infiltrate.   Original Report Authenticated By: Ulyses Southward, M.D.    DISCHARGE EXAMINATION:  General appearance: alert, cooperative, appears stated age and no distress Back: symmetric, no curvature. ROM normal. No CVA tenderness. Cardio: Irregularly irregular. No rubs murmurs. GI: soft, non-tender; bowel sounds normal; no masses,  no organomegaly Male genitalia: Bilateral inguinal hernia noted. Appears to be indirect. Reducible. Non tender. Neurologic: No focal deficits.  DISPOSITION: Home  Discharge Orders   Future Orders Complete By Expires     Diet - low sodium heart healthy  As directed     Discharge instructions  As directed     Comments:      Leave your Foley in till you have seen a Urologist. You will get a call from the heart doctor regarding follow up.    Increase activity slowly  As directed        ALLERGIES: No Known Allergies  Current Discharge Medication List    START taking these medications   Details  diltiazem (CARDIZEM CD) 120 MG 24 hr capsule Take 1 capsule (120 mg total) by mouth daily. Qty: 30 capsule, Refills: 1    ondansetron (ZOFRAN-ODT) 4 MG disintegrating tablet Take 1 tablet (4 mg total) by mouth every 8 (eight) hours as needed for nausea. Qty: 20 tablet, Refills: 0    Rivaroxaban (XARELTO) 20 MG TABS Take 1 tablet (20 mg total) by mouth daily with supper. Qty: 30 tablet, Refills: 1      CONTINUE these medications which have NOT CHANGED   Details  ALPRAZolam (XANAX) 1 MG tablet Take 0.5 mg by mouth 4 (four) times daily as needed for sleep.    pravastatin (PRAVACHOL) 10 MG tablet Take 10 mg by mouth every evening.      STOP taking these medications     amLODipine (NORVASC) 10 MG  tablet      metoprolol succinate (TOPROL-XL) 25 MG 24 hr tablet        Follow-up Information   Follow up with Mickie Hillier, MD. Schedule an appointment as soon as possible for a visit in 1 week.   Contact information:   [address] [place] [address] [phone]       Follow up with Jethro Bolus I, MD. Schedule an appointment as soon as possible for a visit in 3 days. (Leave foley in till seen by the urologist)    Contact information:   [address] [place] [address] [phone]       TOTAL DISCHARGE TIME: 35 mins  [facility]  [facility] Pager [phone]  07/01/2012, 10:48 AM

## 2012-07-01 NOTE — Progress Notes (Addendum)
    Subjective:  Feels fine. No CP or palps.  Objective:  Vital Signs in the last 24 hours: Temp:  [97 F (36.1 C)-98.9 F (37.2 C)] 98.9 F (37.2 C) (06/10 0609) Pulse Rate:  [40-124] 73 (06/10 0609) Resp:  [15-20] 18 (06/10 0609) BP: (98-138)/(67-92) 105/72 mmHg (06/10 0609) SpO2:  [96 %-99 %] 96 % (06/10 0609) Weight:  [60.328 kg (133 lb)] 60.328 kg (133 lb) (06/09 1532)  Intake/Output from previous day: 06/09 0701 - 06/10 0700 In: 297.5 [I.V.:297.5] Out: 1702 [Urine:1701; Stool:1]  Physical Exam: Pt is alert and oriented, pleasant elderly male in NAD HEENT: normal Neck: JVP - normal Lungs: CTA bilaterally CV: distant, irregular, no murmur Abd: soft, NT, Positive BS, no hepatomegaly Ext: no C/C/E Skin: warm/dry no rash   Lab Results:  Recent Labs  06/30/12 1037 07/01/12 0453  WBC 9.5 11.1*  HGB 16.9 14.6  PLT 149* 137*    Recent Labs  06/30/12 1037 07/01/12 0453  NA 140 136  K 4.9 4.1  CL 102 102  CO2 29 27  GLUCOSE 122* 92  BUN 14 14  CREATININE 1.26 1.15   No results found for this basename: TROPONINI, CK, MB,  in the last 72 hours  Cardiac Studies: 2D Echo pending  Tele: Atrial fib heart rate 70-90's, slow heart rate noted last night but I don't see this on tele review  Assessment/Plan:  Atrial fibrillation: asymptomatic. Agree anticoagulation with Xarelto. GFR is about 60 so will increase dose to 20 mg daily. Continue cardizem - will change to CD 120 mg daily. Await 2D echo this am. OK for discharge from cardiac standpoint after echo done. Will arrange follow-up with Dr Patty Sermons or PA/NP in 2 weeks.  Tonny Bollman, M.D. 07/01/2012, 7:24 AM

## 2012-07-01 NOTE — Progress Notes (Signed)
  Echocardiogram 2D Echocardiogram has been performed.  Milind Raether 07/01/2012, 9:00 AM

## 2012-07-02 ENCOUNTER — Emergency Department (HOSPITAL_COMMUNITY): Payer: No Typology Code available for payment source

## 2012-07-02 ENCOUNTER — Encounter (HOSPITAL_COMMUNITY): Payer: Self-pay

## 2012-07-02 ENCOUNTER — Inpatient Hospital Stay (HOSPITAL_COMMUNITY)
Admission: EM | Admit: 2012-07-02 | Discharge: 2012-07-04 | DRG: 700 | Disposition: A | Discharge status: Home Health | Payer: No Typology Code available for payment source | Attending: Internal Medicine | Admitting: Internal Medicine

## 2012-07-02 DIAGNOSIS — R55 Syncope and collapse: Secondary | ICD-10-CM | POA: Diagnosis present

## 2012-07-02 DIAGNOSIS — N390 Urinary tract infection, site not specified: Secondary | ICD-10-CM | POA: Diagnosis present

## 2012-07-02 DIAGNOSIS — F419 Anxiety disorder, unspecified: Secondary | ICD-10-CM

## 2012-07-02 DIAGNOSIS — I10 Essential (primary) hypertension: Secondary | ICD-10-CM | POA: Diagnosis present

## 2012-07-02 DIAGNOSIS — I951 Orthostatic hypotension: Secondary | ICD-10-CM | POA: Diagnosis present

## 2012-07-02 DIAGNOSIS — Y846 Urinary catheterization as the cause of abnormal reaction of the patient, or of later complication, without mention of misadventure at the time of the procedure: Secondary | ICD-10-CM | POA: Diagnosis present

## 2012-07-02 DIAGNOSIS — D696 Thrombocytopenia, unspecified: Secondary | ICD-10-CM | POA: Diagnosis present

## 2012-07-02 DIAGNOSIS — T83511A Infection and inflammatory reaction due to indwelling urethral catheter, initial encounter: Principal | ICD-10-CM | POA: Diagnosis present

## 2012-07-02 DIAGNOSIS — R338 Other retention of urine: Secondary | ICD-10-CM

## 2012-07-02 DIAGNOSIS — I4891 Unspecified atrial fibrillation: Secondary | ICD-10-CM | POA: Diagnosis present

## 2012-07-02 HISTORY — DX: Unspecified atrial fibrillation: I48.91

## 2012-07-02 LAB — URINALYSIS, ROUTINE W REFLEX MICROSCOPIC
Ketones, ur: NEGATIVE mg/dL
Nitrite: NEGATIVE
Protein, ur: 100 mg/dL — AB
pH: 5.5 (ref 5.0–8.0)

## 2012-07-02 LAB — COMPREHENSIVE METABOLIC PANEL
Alkaline Phosphatase: 73 U/L (ref 39–117)
BUN: 12 mg/dL (ref 6–23)
Creatinine, Ser: 1.1 mg/dL (ref 0.50–1.35)
GFR calc Af Amer: 72 mL/min — ABNORMAL LOW (ref >90)
Glucose, Bld: 94 mg/dL (ref 70–99)
Potassium: 4.4 mEq/L (ref 3.5–5.1)
Total Protein: 6.8 g/dL (ref 6.0–8.3)

## 2012-07-02 LAB — CBC WITH DIFFERENTIAL/PLATELET
Eosinophils Absolute: 0.6 10*3/uL (ref 0.0–0.7)
Eosinophils Relative: 7 % — ABNORMAL HIGH (ref 0–5)
HCT: 45.9 % (ref 39.0–52.0)
Hemoglobin: 15.8 g/dL (ref 13.0–17.0)
Lymphs Abs: 1.4 10*3/uL (ref 0.7–4.0)
MCH: 31.5 pg (ref 26.0–34.0)
MCV: 91.6 fL (ref 78.0–100.0)
Monocytes Absolute: 0.7 10*3/uL (ref 0.1–1.0)
Monocytes Relative: 8 % (ref 3–12)
Neutrophils Relative %: 68 % (ref 43–77)
RBC: 5.01 MIL/uL (ref 4.22–5.81)

## 2012-07-02 MED ORDER — DEXTROSE 5 % IV SOLN
1.0000 g | INTRAVENOUS | Status: DC
  Administered 2012-07-02 – 2012-07-03 (×2): 1 g via INTRAVENOUS
  Filled 2012-07-02 (×3): qty 10

## 2012-07-02 MED ORDER — ALPRAZOLAM 1 MG PO TABS
1.0000 mg | ORAL_TABLET | Freq: Every day | ORAL | Status: DC
  Administered 2012-07-02: 1 mg via ORAL
  Filled 2012-07-02: qty 1

## 2012-07-02 MED ORDER — ONDANSETRON 4 MG PO TBDP
4.0000 mg | ORAL_TABLET | Freq: Three times a day (TID) | ORAL | Status: DC | PRN
  Filled 2012-07-02: qty 1

## 2012-07-02 MED ORDER — DILTIAZEM HCL ER COATED BEADS 120 MG PO CP24
120.0000 mg | ORAL_CAPSULE | Freq: Every day | ORAL | Status: DC
  Administered 2012-07-03 – 2012-07-04 (×2): 120 mg via ORAL
  Filled 2012-07-02 (×2): qty 1

## 2012-07-02 MED ORDER — SODIUM CHLORIDE 0.9 % IV SOLN
1000.0000 mL | INTRAVENOUS | Status: AC
  Administered 2012-07-03 (×2): 1000 mL via INTRAVENOUS

## 2012-07-02 MED ORDER — ONDANSETRON HCL 4 MG PO TABS: 4.0000 mg | ORAL_TABLET | Freq: Four times a day (QID) | ORAL | Status: DC | PRN

## 2012-07-02 MED ORDER — RIVAROXABAN 20 MG PO TABS
20.0000 mg | ORAL_TABLET | Freq: Every day | ORAL | Status: DC
  Administered 2012-07-02 – 2012-07-03 (×2): 20 mg via ORAL
  Filled 2012-07-02 (×3): qty 1

## 2012-07-02 MED ORDER — ALPRAZOLAM 0.5 MG PO TABS
0.5000 mg | ORAL_TABLET | Freq: Three times a day (TID) | ORAL | Status: DC
  Administered 2012-07-03: 0.5 mg via ORAL
  Filled 2012-07-02: qty 1

## 2012-07-02 MED ORDER — POLYETHYLENE GLYCOL 3350 17 G PO PACK
17.0000 g | PACK | Freq: Every day | ORAL | Status: DC | PRN
  Filled 2012-07-02: qty 1

## 2012-07-02 MED ORDER — SIMVASTATIN 5 MG PO TABS
5.0000 mg | ORAL_TABLET | Freq: Every day | ORAL | Status: DC
  Administered 2012-07-02 – 2012-07-03 (×2): 5 mg via ORAL
  Filled 2012-07-02 (×3): qty 1

## 2012-07-02 MED ORDER — ACETAMINOPHEN 325 MG PO TABS
650.0000 mg | ORAL_TABLET | Freq: Four times a day (QID) | ORAL | Status: DC | PRN
  Administered 2012-07-03 – 2012-07-04 (×2): 650 mg via ORAL
  Filled 2012-07-02 (×2): qty 2

## 2012-07-02 MED ORDER — DEXTROSE 5 % IV SOLN
1.0000 g | Freq: Once | INTRAVENOUS | Status: AC
  Administered 2012-07-02: 1 g via INTRAVENOUS
  Filled 2012-07-02: qty 10

## 2012-07-02 MED ORDER — ONDANSETRON HCL 4 MG/2ML IJ SOLN
4.0000 mg | Freq: Four times a day (QID) | INTRAMUSCULAR | Status: DC | PRN
  Administered 2012-07-03 (×2): 4 mg via INTRAVENOUS
  Filled 2012-07-02 (×2): qty 2

## 2012-07-02 MED ORDER — SODIUM CHLORIDE 0.9 % IV SOLN
1000.0000 mL | INTRAVENOUS | Status: DC
  Administered 2012-07-02: 1000 mL via INTRAVENOUS

## 2012-07-02 MED ORDER — ACETAMINOPHEN 650 MG RE SUPP: 650.0000 mg | Freq: Four times a day (QID) | RECTAL | Status: DC | PRN

## 2012-07-02 NOTE — ED Notes (Signed)
MD at bedside. 

## 2012-07-02 NOTE — H&P (Signed)
[facility] History and Physical  Shannon Kirby ZOX:[id number] DOB: [birth year] DOA: 07/02/2012  Referring physician: Dr. Nira Conn PCP: Mickie Hillier, MD  Specialists: none  Chief Complaint: near syncope  HPI: Shannon Kirby is a 79 y.o. male  Past medical history of hypertension, newly diagnosed A. fib for for which he was discharged and 07/01/2012 on Cardizem and Xarelto also with acute urinary retention sent home with a Foley comes in for near syncope on the morning of admission. He relates he has been getting dizzy the last 12 hours every time he tries to stand up. AThis afternoon he tried to stand up and he was just about to fall and did not hit his head as his daughter caught him. She relates he was white, sweaty and he decribes palpitations so he was brought here to the emergency room.  - He denies any fever chills nausea vomiting or diarrhea, cough or sick contacts.  In the ED: An EKG was done that showed atrial fibrillation rate controlled with a right bundle branch block unchanged from previous. A UA was done that showed large amounts of white blood cells had a few bacteria, orthostatics were checked which were positive, so we were asked to admit and further evaluate  Review of Systems: The patient denies anorexia, fever, weight loss,, vision loss, decreased hearing, hoarseness, chest pain,  dyspnea on exertion, peripheral edema, balance deficits, hemoptysis, abdominal pain, melena, hematochezia, severe indigestion/heartburn, hematuria, incontinence, genital sores, muscle weakness, suspicious skin lesions, transient blindness, difficulty walking, depression, unusual weight change, abnormal bleeding, enlarged lymph nodes, angioedema, and breast masses.    Past Medical History  Diagnosis Date  . Hypertension   . Hernia     L groin   . Hyperlipidemia   . Pre-diabetes   . A-fib    Past Surgical History  Procedure Laterality Date  . No past surgeries     Social History:  reports  that he has been smoking.  He has never used smokeless tobacco. He reports that he does not drink alcohol or use illicit drugs.   No Known Allergies  Family History  Problem Relation Age of Onset  . Other Mother     Passed at 73 of "blood clot in chest"  . Colon cancer Father     Passed at 45  . Heart attack Sister     Passed away 58   Prior to Admission medications   Medication Sig Start Date End Date Taking? Authorizing Provider  ALPRAZolam Prudy Feeler) 1 MG tablet Take 0.5-1 mg by mouth 4 (four) times daily - after meals and at bedtime. 0.5mg  tid and 1mg  qhs   Yes Historical Provider, MD  diltiazem (CARDIZEM CD) 120 MG 24 hr capsule Take 1 capsule (120 mg total) by mouth daily. 07/01/12  Yes Osvaldo Shipper, MD  ondansetron (ZOFRAN-ODT) 4 MG disintegrating tablet Take 1 tablet (4 mg total) by mouth every 8 (eight) hours as needed for nausea. 07/01/12  Yes Osvaldo Shipper, MD  pravastatin (PRAVACHOL) 10 MG tablet Take 10 mg by mouth every evening.   Yes Historical Provider, MD  Rivaroxaban (XARELTO) 20 MG TABS Take 1 tablet (20 mg total) by mouth daily with supper. 07/01/12  Yes Osvaldo Shipper, MD   Physical Exam: Filed Vitals:   07/02/12 1715 07/02/12 1730 07/02/12 1745 07/02/12 1800  BP: 125/59 129/61 113/69 110/65  Pulse: 78 86 92 92  Temp:      TempSrc:      Resp: 13 19 19  18  SpO2: 98% 98% 95% 95%    BP 110/65  Pulse 92  Temp(Src) 98.4 F (36.9 C) (Rectal)  Resp 18  SpO2 95%  General Appearance:    Alert, cooperative, no distress, appears stated age  Head:    Normocephalic, without obvious abnormality, atraumatic           Throat:   Lips, mucosa, and tongue are dry   Neck:   Supple, symmetrical, trachea midline, no adenopathy;       thyroid:  No enlargement/tenderness/nodules; no carotid   bruit or JVD  Back:     Symmetric, no curvature, ROM normal, no CVA tenderness  Lungs:     Clear to auscultation bilaterally, respirations unlabored  Chest wall:    No tenderness or  deformity  Heart:    Regular rate and rhythm, S1 and S2 normal, no murmur, rub   or gallop  Abdomen:     Soft, non-tender, bowel sounds active all four quadrants,    no masses, no organomegaly        Extremities:   Extremities normal, atraumatic, no cyanosis or edema  Pulses:   2+ and symmetric all extremities  Skin:   Skin color, texture, turgor normal, no rashes or lesions  Lymph nodes:   Cervical, supraclavicular, and axillary nodes normal  Neurologic:   CNII-XII intact. Normal strength, sensation and reflexes      throughout    Labs on Admission:  Basic Metabolic Panel:  Recent Labs Lab 06/30/12 1037 07/01/12 0453 07/02/12 1605  NA 140 136 139  K 4.9 4.1 4.4  CL 102 102 102  CO2 29 27 27   GLUCOSE 122* 92 94  BUN 14 14 12   CREATININE 1.26 1.15 1.10  CALCIUM 9.7 8.7 9.6   Liver Function Tests:  Recent Labs Lab 07/01/12 0453 07/02/12 1605  AST 12 15  ALT 10 10  ALKPHOS 62 73  BILITOT 0.7 0.6  PROT 5.8* 6.8  ALBUMIN 3.3* 3.9   No results found for this basename: LIPASE, AMYLASE,  in the last 168 hours No results found for this basename: AMMONIA,  in the last 168 hours CBC:  Recent Labs Lab 06/30/12 1037 07/01/12 0453 07/02/12 1605  WBC 9.5 11.1* 8.9  NEUTROABS  --   --  6.0  HGB 16.9 14.6 15.8  HCT 48.8 42.8 45.9  MCV 92.4 92.4 91.6  PLT 149* 137* 124*   Cardiac Enzymes: No results found for this basename: CKTOTAL, CKMB, CKMBINDEX, TROPONINI,  in the last 168 hours  BNP (last 3 results) No results found for this basename: PROBNP,  in the last 8760 hours CBG: No results found for this basename: GLUCAP,  in the last 168 hours  Radiological Exams on Admission: Dg Chest 2 View  07/02/2012   *RADIOLOGY REPORT*  Clinical Data: Hypotension, syncope, and weakness  CHEST - 2 VIEW  Comparison: Chest radiograph 06/30/2012  Findings: Normal mediastinum and cardiac silhouette.  Normal pulmonary  vasculature.  No evidence of effusion, infiltrate, or  pneumothorax.  No acute bony abnormality. Degenerative osteophytosis of the thoracic spine.  IMPRESSION: No acute cardiopulmonary process.   Original Report Authenticated By: Genevive Bi, M.D.    EKG: Independently reviewed. Atrophic relation rate control normal axis right bundle branch block  Assessment/Plan  Near syncope due to Orthostatic hypotension: - Likely cause of his orthostatic hypotension is probably a urinary tract infection as he has large amounts of l white blood cells in his urine. He is nonseptic appearing, has no  fever or leukocytosis. In the emergency room his orthostatics were positive. They have started him on empiric Rocephin which I have agreed. They have also sent for urine culture. - I agree with continuing IV fluids and following strict I's and O's. Initially in the ED his blood pressure was in the 80s. After a liter of fluid and has come up to 130s. - We'll get blood cultures.     UTI (lower urinary tract infection): - Continue Rocephin urine cultures were sent.   Atrial fibrillation: - Currently rate controlled continue current home medications. Cardizem and Xarelto.    HTN (hypertension), benign: - He's only on diltiazem for hypertension. We'll continue these medications as needed for each relation. Currently not on a diuretic at home.  Thrombocytopenia: -  Mildly decreased from previous admission. At that time he was treated with heparin for DVT prophylaxis. We'll continue to monitor closely. His currently on Xarelto - He denies any chest pain, shortness of breath, is not tachycardic and is not hypoxic.  Code Status: Full Family Communication: daughter Disposition Plan: inpatient  Time spent: 75 minutes  Marinda Elk [facility] Pager [phone]  If 7PM-7AM, please contact night-coverage [email or web address] Password [facility] 07/02/2012, 7:20 PM

## 2012-07-02 NOTE — ED Notes (Signed)
Bed:WA04<BR> Expected date:<BR> Expected time:<BR> Means of arrival:<BR> Comments:<BR>

## 2012-07-02 NOTE — ED Provider Notes (Signed)
History    CSN: [id number] Arrival date & time 07/02/12  1527 First MD Initiated Contact with Patient 07/02/12 1543      Chief Complaint  Patient presents with  . Near Syncope  . Hypotension  . Weakness    HPI Patient presents to the emergency room after having an episode of dizziness and weakness.  Patient was recently in the hospital for urinary retention. His ED evaluation they also found him to be in atrial fibrillation. It was unclear how long he actually been in atrial fibrillation for but he was started on Xarelto and Cardizem for rate control.  Patient continues to have a Foley catheter in place. He was at home today trying to take a shower when he became very lightheaded. He had to call his family member for help. EMS was called initial blood pressure was 80/60. He was given IV fluids with some improvement. Patient denies any chest pain nausea vomiting or diarrhea. He is very concerned about the weakness that he experienced.   Past Medical History  Diagnosis Date  . Hypertension   . Hernia     L groin   . Hyperlipidemia   . Pre-diabetes   . A-fib     Past Surgical History  Procedure Laterality Date  . No past surgeries      Family History  Problem Relation Age of Onset  . Other Mother     Passed at 91 of "blood clot in chest"  . Colon cancer Father     Passed at 46  . Heart attack Sister     Passed away 6    History  Substance Use Topics  . Smoking status: Current Every Day Smoker -- 1.00 packs/day for 63 years  . Smokeless tobacco: Never Used     Comment: Smoked since age 6  . Alcohol Use: No      Review of Systems  Constitutional: Negative for fever.  Respiratory: Negative for chest tightness.   Cardiovascular: Negative for chest pain.  Genitourinary: Negative for dysuria.  Neurological: Negative for headaches.  All other systems reviewed and are negative.    Allergies  Review of patient's allergies indicates no known allergies.  Home  Medications   Current Outpatient Rx  Name  Route  Sig  Dispense  Refill  . ALPRAZolam (XANAX) 1 MG tablet   Oral   Take 0.5-1 mg by mouth 4 (four) times daily - after meals and at bedtime. 0.5mg  tid and 1mg  qhs         . diltiazem (CARDIZEM CD) 120 MG 24 hr capsule   Oral   Take 1 capsule (120 mg total) by mouth daily.   30 capsule   1   . ondansetron (ZOFRAN-ODT) 4 MG disintegrating tablet   Oral   Take 1 tablet (4 mg total) by mouth every 8 (eight) hours as needed for nausea.   20 tablet   0   . pravastatin (PRAVACHOL) 10 MG tablet   Oral   Take 10 mg by mouth every evening.         . Rivaroxaban (XARELTO) 20 MG TABS   Oral   Take 1 tablet (20 mg total) by mouth daily with supper.   30 tablet   1   . nicotine (NICODERM CQ - DOSED IN MG/24 HOURS) 14 mg/24hr patch   Transdermal   Place 1 patch onto the skin daily.   28 patch   0     BP 106/54  Pulse 71  Temp(Src) 97.3 F (36.3 C) (Oral)  Resp 18  Ht 6' 3.5" (1.918 m)  Wt 134 lb 14.7 oz (61.2 kg)  BMI 16.64 kg/m2  SpO2 97%  Physical Exam  Nursing note and vitals reviewed. Constitutional: He appears well-developed and well-nourished. No distress.  HENT:  Head: Normocephalic and atraumatic.  Right Ear: External ear normal.  Left Ear: External ear normal.  Eyes: Conjunctivae are normal. Right eye exhibits no discharge. Left eye exhibits no discharge. No scleral icterus.  Neck: Neck supple. No tracheal deviation present.  Cardiovascular: Normal rate, regular rhythm and intact distal pulses.   Pulmonary/Chest: Effort normal and breath sounds normal. No stridor. No respiratory distress. He has no wheezes. He has no rales.  Abdominal: Soft. Bowel sounds are normal. He exhibits no distension. There is no tenderness. There is no rebound and no guarding.  Genitourinary:  Foley catheter draining yellow urine  Musculoskeletal: He exhibits no edema and no tenderness.  Neurological: He is alert. He has normal  strength. No sensory deficit. Cranial nerve deficit:  no gross defecits noted. He exhibits normal muscle tone. He displays no seizure activity. Coordination normal.  Able to lift both arms and legs off the bed, no focal deficits noted, no facial droop  Skin: Skin is warm and dry. No rash noted.  Psychiatric: He has a normal mood and affect.    ED Course  Procedures (including critical care time) EKG Atrial fibrillation with a rate of 86 Normal axis Right bundle-branch block No acute ST-T wave changes No significant change when compared to prior EKG except rate is slower  Labs Reviewed  CBC WITH DIFFERENTIAL - Abnormal; Notable for the following:    Platelets 124 (*)    Eosinophils Relative 7 (*)    All other components within normal limits  COMPREHENSIVE METABOLIC PANEL - Abnormal; Notable for the following:    GFR calc non Af Amer 62 (*)    GFR calc Af Amer 72 (*)    All other components within normal limits  URINALYSIS, ROUTINE W REFLEX MICROSCOPIC - Abnormal; Notable for the following:    Color, Urine AMBER (*)    APPearance CLOUDY (*)    Hgb urine dipstick LARGE (*)    Protein, ur 100 (*)    Leukocytes, UA MODERATE (*)    All other components within normal limits  CBC - Abnormal; Notable for the following:    Platelets 119 (*)    All other components within normal limits  CBC - Abnormal; Notable for the following:    Platelets 118 (*)    All other components within normal limits  COMPREHENSIVE METABOLIC PANEL - Abnormal; Notable for the following:    Glucose, Bld 101 (*)    Total Protein 5.6 (*)    Albumin 3.0 (*)    Total Bilirubin 0.2 (*)    GFR calc non Af Amer 77 (*)    GFR calc Af Amer 90 (*)    All other components within normal limits  URINE CULTURE  URINE MICROSCOPIC-ADD ON  TROPONIN I  TROPONIN I  TROPONIN I  POCT I-STAT TROPONIN I   Dg Chest Port 1 View  07/03/2012   *RADIOLOGY REPORT*  Clinical Data: Nausea vomiting weakness.  Short of breath   PORTABLE CHEST - 1 VIEW  Comparison: 07/02/2012  Findings: COPD with pulmonary hyperinflation and hyperlucency. Mild scarring in the lung bases.  Negative for pneumonia.  Negative for heart failure or mass lesion.  IMPRESSION: COPD without acute abnormality.  Original Report Authenticated By: Janeece Riggers, M.D.   Dg Abd Portable 2v  07/03/2012   *RADIOLOGY REPORT*  Clinical Data: Nausea vomiting and weakness.  PORTABLE ABDOMEN - 2 VIEW  Comparison: None  Findings: Negative for bowel obstruction.  No free air.  No acute bony abnormality.  No kidney stones.  Vascular calcifications in the pelvis.  IMPRESSION: No acute abnormality.   Original Report Authenticated By: Janeece Riggers, M.D.     1. Orthostatic hypotension   2. UTI (urinary tract infection)   3. Atrial fibrillation   4. Near syncope       MDM  Pt with near syncope, hypotension.  Will admit for monitoring, further treatment.  Remained stable in the ED.       Celene Kras, MD 07/05/12 [phone]

## 2012-07-02 NOTE — ED Notes (Signed)
Per EMS- Pt reports feel dizzy and weak. Upon standing felt faint.  EMS arrived BP 88/60 bolus infused BP after bolus 98/70. New onset afib 2 days ago. Dx here at [facility].  Seen and treated recently for Afib.

## 2012-07-03 ENCOUNTER — Inpatient Hospital Stay (HOSPITAL_COMMUNITY): Payer: No Typology Code available for payment source

## 2012-07-03 DIAGNOSIS — R338 Other retention of urine: Secondary | ICD-10-CM

## 2012-07-03 LAB — CBC
HCT: 40.7 % (ref 39.0–52.0)
MCH: 30.5 pg (ref 26.0–34.0)
MCHC: 32.9 g/dL (ref 30.0–36.0)
MCV: 92.5 fL (ref 78.0–100.0)
RDW: 12.4 % (ref 11.5–15.5)
WBC: 9.1 10*3/uL (ref 4.0–10.5)

## 2012-07-03 MED ORDER — NICOTINE 14 MG/24HR TD PT24
14.0000 mg | MEDICATED_PATCH | Freq: Every day | TRANSDERMAL | Status: DC
  Administered 2012-07-03 – 2012-07-04 (×2): 14 mg via TRANSDERMAL
  Filled 2012-07-03 (×2): qty 1

## 2012-07-03 MED ORDER — ALPRAZOLAM 0.5 MG PO TABS: 0.5000 mg | ORAL_TABLET | Freq: Three times a day (TID) | ORAL | Status: DC | PRN

## 2012-07-03 MED ORDER — ENSURE COMPLETE PO LIQD
237.0000 mL | Freq: Three times a day (TID) | ORAL | Status: DC
  Administered 2012-07-03 – 2012-07-04 (×2): 237 mL via ORAL

## 2012-07-03 MED ORDER — ALBUTEROL SULFATE (5 MG/ML) 0.5% IN NEBU: 2.5000 mg | INHALATION_SOLUTION | RESPIRATORY_TRACT | Status: DC | PRN

## 2012-07-03 MED ORDER — TRAMADOL HCL 50 MG PO TABS
50.0000 mg | ORAL_TABLET | Freq: Four times a day (QID) | ORAL | Status: DC | PRN
  Administered 2012-07-04: 50 mg via ORAL
  Filled 2012-07-03: qty 1

## 2012-07-03 MED ORDER — ALPRAZOLAM 0.5 MG PO TABS
0.5000 mg | ORAL_TABLET | Freq: Three times a day (TID) | ORAL | Status: DC
  Administered 2012-07-03 – 2012-07-04 (×3): 0.5 mg via ORAL
  Filled 2012-07-03 (×3): qty 1

## 2012-07-03 MED ORDER — DIAZEPAM 2 MG PO TABS
2.0000 mg | ORAL_TABLET | Freq: Three times a day (TID) | ORAL | Status: DC | PRN
  Administered 2012-07-03: 2 mg via ORAL
  Filled 2012-07-03: qty 1

## 2012-07-03 NOTE — Evaluation (Signed)
Physical Therapy Evaluation Patient Details Name: Shannon Kirby MRN: [id number] DOB: February 18, 1931 Today's Date: 07/03/2012 Time: 3086-5784 PT Time Calculation (min): 14 min  PT Assessment / Plan / Recommendation Clinical Impression  Pt is a 79 year old male admitted for near syncope due to orthostatic hypotension.  Pt would benefit from acute PT services to safely assist in mobilizing pt and preparing for d/c home which includes stairs.  Pt reports slight dizziness upon standing however declined ambulation due to wanting to sleep.  Pt educated in ankle pumps and marching in place with positional changes if feeling dizziness.      PT Assessment  Patient needs continued PT services    Follow Up Recommendations  Home health PT    Does the patient have the potential to tolerate intense rehabilitation      Barriers to Discharge        Equipment Recommendations  None recommended by PT    Recommendations for Other Services     Frequency Min 3X/week    Precautions / Restrictions Precautions Precautions: Fall   Pertinent Vitals/Pain Orthostatics in docflowsheets (negative)      Mobility  Bed Mobility Bed Mobility: Supine to Sit;Sit to Supine Supine to Sit: 5: Supervision;With rails;HOB elevated Sit to Supine: 5: Supervision;HOB flat Transfers Transfers: Sit to Stand;Stand to Sit Sit to Stand: 4: Min guard;With upper extremity assist;From bed Stand to Sit: 4: Min guard;With upper extremity assist;To bed Details for Transfer Assistance: performed orthostatics (in docflowsheets) negative however pt reports "slight" dizziness upon standing, discussed marching in place in front of bed/chair prior to starting to ambulate for safety if feeling dizzy, also performing ankle pumps if dizzy upon sitting Ambulation/Gait Ambulation/Gait Assistance: Not tested (comment) (pt declined)    Exercises     PT Diagnosis: Difficulty walking  PT Problem List: Decreased activity tolerance;Decreased  mobility;Decreased knowledge of use of DME PT Treatment Interventions: DME instruction;Gait training;Functional mobility training;Therapeutic activities;Therapeutic exercise;Stair training;Patient/family education;Balance training   PT Goals Acute Rehab PT Goals PT Goal Formulation: With patient Time For Goal Achievement: 07/17/12 Potential to Achieve Goals: Good Pt will go Sit to Stand: with modified independence PT Goal: Sit to Stand - Progress: Goal set today Pt will go Stand to Sit: with modified independence PT Goal: Stand to Sit - Progress: Goal set today Pt will Ambulate: 51 - 150 feet;with modified independence PT Goal: Ambulate - Progress: Goal set today Pt will Go Up / Down Stairs: 3-5 stairs;with modified independence;with rail(s) PT Goal: Up/Down Stairs - Progress: Goal set today Pt will Perform Home Exercise Program: with supervision, verbal cues required/provided PT Goal: Perform Home Exercise Program - Progress: Goal set today  Visit Information  Last PT Received On: 07/03/12 Assistance Needed: +1    Subjective Data  Subjective: I just want to get some sleep.  WIll you ask the nurse for something?   Prior Functioning  Home Living Lives With: Daughter Available Help at Discharge: Family Type of Home: House Home Access: Stairs to enter Secretary/administrator of Steps: 4 Entrance Stairs-Rails: Left Home Layout: One level Bathroom Shower/Tub: Engineer, manufacturing systems: Standard Bathroom Accessibility: Yes Home Adaptive Equipment: Walker - rolling Prior Function Level of Independence: Independent Able to Take Stairs?: Yes Driving: Yes Vocation: Retired Comments: laid Scientist, research (medical) Communication: No difficulties Dominant Hand: Right    Cognition  Cognition Arousal/Alertness: Awake/alert Behavior During Therapy: WFL for tasks assessed/performed Overall Cognitive Status: Within Functional Limits for tasks assessed    Extremity/Trunk  Assessment Right Upper  Extremity Assessment RUE ROM/Strength/Tone: [facility] for tasks assessed Left Upper Extremity Assessment LUE ROM/Strength/Tone: [facility] for tasks assessed Right Lower Extremity Assessment RLE ROM/Strength/Tone: [facility] for tasks assessed Left Lower Extremity Assessment LLE ROM/Strength/Tone: [facility] for tasks assessed   Balance    End of Session PT - End of Session Activity Tolerance: Patient tolerated treatment well Patient left: in bed;with call bell/phone within reach  GP     [name],KATHrine E 07/03/2012, 11:47 AM Zenovia Jarred, PT, DPT 07/03/2012 Pager: [phone]

## 2012-07-03 NOTE — Progress Notes (Signed)
Pt had two attacks that seemed similar to panic attacks. MD was notified. Vital signs were stable. Pt has not smoked a ciagarette in two days so a nicotine patch was ordered. Will continue to monitor. Patient is currently resting comfortably.

## 2012-07-03 NOTE — Progress Notes (Addendum)
[facility] PROGRESS NOTE  Shannon Kirby ZOX:[id number] DOB: 09/01/1931 DOA: 07/02/2012 PCP: Mickie Hillier, MD  Assessment/Plan: Near syncope due to Orthostatic hypotension:  - Likely cause of his orthostatic hypotension is probably a urinary tract infection as he has large amounts of l white blood cells in his urine. He is nonseptic appearing, has no fever or leukocytosis. In the emergency room his orthostatics were positive. T - empiric Rocephin await urine culture.  - blood cultures.   UTI (lower urinary tract infection): UTI due to Indwelling Catheter - Continue Rocephin urine cultures were sent.  Atrial fibrillation:  - Currently rate controlled continue current home medications. Cardizem and Xarelto -brady while sleeping- no symptoms  HTN (hypertension), benign:  - He's only on diltiazem for hypertension. We'll continue these medications as needed for each relation. Currently not on a diuretic at home.   Thrombocytopenia:  - Mildly decreased from previous admission. At that time he was treated with heparin for DVT prophylaxis. We'll continue to monitor closely. His currently on Xarelto   SOB -check x ray  N/V -zofran -check abd x ray  Code Status: full Family Communication: patient at bedside Disposition Plan: home once work up done   Consultants:    Procedures:    Antibiotics:    HPI/Subjective: Tired, no dizziness, no CP, no SOB  Objective: Filed Vitals:   07/02/12 1800 07/02/12 2111 07/03/12 0500 07/03/12 0524  BP: 110/65 111/52  107/70  Pulse: 92 71  69  Temp:  98 F (36.7 C)  97.7 F (36.5 C)  TempSrc:  Oral  Oral  Resp: 18 18  18   Height:  6' 3.5" (1.918 m)    Weight:  61.054 kg (134 lb 9.6 oz) 61.5 kg (135 lb 9.3 oz)   SpO2: 95% 96%  95%    Intake/Output Summary (Last 24 hours) at 07/03/12 0846 Last data filed at 07/03/12 0655  Gross per 24 hour  Intake   1725 ml  Output   1001 ml  Net    724 ml   Filed Weights   07/02/12  2111 07/03/12 0500  Weight: 61.054 kg (134 lb 9.6 oz) 61.5 kg (135 lb 9.3 oz)    Exam:   General:  Sleeping= awake easily  Cardiovascular: irregular but rate controlled  Respiratory: clear anterior  Abdomen: +BS, soft, NT  Musculoskeletal: moves all 4 ext   Data Reviewed: Basic Metabolic Panel:  Recent Labs Lab 06/30/12 1037 07/01/12 0453 07/02/12 1605  NA 140 136 139  K 4.9 4.1 4.4  CL 102 102 102  CO2 29 27 27   GLUCOSE 122* 92 94  BUN 14 14 12   CREATININE 1.26 1.15 1.10  CALCIUM 9.7 8.7 9.6   Liver Function Tests:  Recent Labs Lab 07/01/12 0453 07/02/12 1605  AST 12 15  ALT 10 10  ALKPHOS 62 73  BILITOT 0.7 0.6  PROT 5.8* 6.8  ALBUMIN 3.3* 3.9   No results found for this basename: LIPASE, AMYLASE,  in the last 168 hours No results found for this basename: AMMONIA,  in the last 168 hours CBC:  Recent Labs Lab 06/30/12 1037 07/01/12 0453 07/02/12 1605 07/03/12 0516  WBC 9.5 11.1* 8.9 9.1  NEUTROABS  --   --  6.0  --   HGB 16.9 14.6 15.8 13.4  HCT 48.8 42.8 45.9 40.7  MCV 92.4 92.4 91.6 92.5  PLT 149* 137* 124* 119*   Cardiac Enzymes: No results found for this basename: CKTOTAL, CKMB, CKMBINDEX, TROPONINI,  in the last 168 hours BNP (last 3 results) No results found for this basename: PROBNP,  in the last 8760 hours CBG: No results found for this basename: GLUCAP,  in the last 168 hours  No results found for this or any previous visit (from the past 240 hour(s)).   Studies: Dg Chest 2 View  07/02/2012   *RADIOLOGY REPORT*  Clinical Data: Hypotension, syncope, and weakness  CHEST - 2 VIEW  Comparison: Chest radiograph 06/30/2012  Findings: Normal mediastinum and cardiac silhouette.  Normal pulmonary  vasculature.  No evidence of effusion, infiltrate, or pneumothorax.  No acute bony abnormality. Degenerative osteophytosis of the thoracic spine.  IMPRESSION: No acute cardiopulmonary process.   Original Report Authenticated By: Genevive Bi,  M.D.    Scheduled Meds: . ALPRAZolam  0.5 mg Oral TID WC  . ALPRAZolam  1 mg Oral QHS  . cefTRIAXone (ROCEPHIN)  IV  1 g Intravenous Q24H  . diltiazem  120 mg Oral Daily  . Rivaroxaban  20 mg Oral Q supper  . simvastatin  5 mg Oral q1800   Continuous Infusions: . sodium chloride 1,000 mL (07/03/12 0437)    Active Problems:   Atrial fibrillation   HTN (hypertension), benign   Orthostatic hypotension   UTI (lower urinary tract infection)   Near syncope    Time spent: 35    [facility], Tryniti Laatsch  [facility] Pager [phone]. If 7PM-7AM, please contact night-coverage at [email or web address], password [facility] 07/03/2012, 8:46 AM  LOS: 1 day

## 2012-07-03 NOTE — Clinical Documentation Improvement (Signed)
GENERIC DOCUMENTATION CLARIFICATION QUERY  THIS DOCUMENT IS NOT A PERMANENT PART OF THE MEDICAL RECORD  TO RESPOND TO THE THIS QUERY, FOLLOW THE INSTRUCTIONS BELOW:  1. If needed, update documentation for the patient's encounter via the notes activity.  2. Access this query again and click edit on the In [employer].  3. After updating, or not, click F2 to complete all highlighted (required) fields concerning your review. Select "additional documentation in the medical record" OR "no additional documentation provided".  4. Click Sign note button.  5. The deficiency will fall out of your In Basket *Please let us know if you are not able to complete this workflow by phone or e-mail (listed below).  Please update your documentation within the medical record to reflect your response to this query.                                                                                        07/03/12   Dear Dr[facility],  In a better effort to capture your patient's severity of illness, reflect appropriate length of stay and utilization of resources, a review of the patient medical record has revealed the following indicators.    Based on your clinical judgment, please clarify and document in a progress note and/or discharge summary the clinical condition associated with the following supporting information:  In responding to this query please exercise your independent judgment.  The fact that a query is asked, does not imply that any particular answer is desired or expected.  per 07/02/12 H&P "Past medical history of hypertension, newly diagnosed A. fib for for which he was discharged and 07/01/2012 on Cardizem and Xarelto also with acute urinary retention sent home with a Foley comes in for near syncope on the morning of admission."..." UTI (lower urinary tract infection):-Continue Rocephin urine cultures were sent.".Marland KitchenMarland Kitchen After study & for accurate DX specificity/severity please help  establish any probable, suspected or likely etiology/link for noted "UTI". Thank you  Possible Clinical Conditions? UTI UTI due to Indwelling Catheter UTI not due to Indwelling Catheter  Other Condition Cannot Clinically Determine   Supporting Information: Risk Factors: See Above note  Signs & Symptoms: See Above note  Diagnostics: See Above note  Treatment See Above note  You may use possible, probable, or suspect with inpatient documentation. possible, probable, suspected diagnoses MUST be documented at the time of discharge  Reviewed: additional documentation in the medical record  Thank You,  Toribio Harbour, RN, BSN, CCDS Certified Clinical Documentation Specialist Pager: [phone]  Health Information Management [place]

## 2012-07-03 NOTE — Progress Notes (Signed)
07/03/12-K.Shorr,NP notified via text at 01:50am of pt's con't drops in HR to 30-40 range, non sustained other than one time a six sec 40HR. Pt is asymptomatic, denies pain, & is tired of being awaken to ask if he is okay. Presently HR is 57-71 range & is A-Fib on tele  Monitor. EKG shows A-Fib with slow ventericular response & Rt Bundle Branch Block. Pt is admitted with syncopal episodes after recent discharge on 07/01/12. Will con't to monitor.

## 2012-07-03 NOTE — Progress Notes (Signed)
INITIAL NUTRITION ASSESSMENT  DOCUMENTATION CODES Per approved criteria  -Underweight   INTERVENTION: - Ensure Complete TID - Encouraged bland diet while pt having nausea - Will continue to monitor   NUTRITION DIAGNOSIS: Unintended weight loss related to nausea/vomiting from panic attacks as evidenced by pt report.   Goal: Pt to consume 100% of meals/supplements  Monitor:  Weights, labs, intake  Reason for Assessment: Nutrition risk, underweight  79 y.o. male  Admitting Dx: Near syncope  ASSESSMENT: Pt reports eating 3 meals/day at home with good appetite. Pt reports 3-4 pound unintended weight loss in the past month. Pt reports when he gets panic attacks he gets nausea and vomiting, and reports having 2 panic attacks today. Pt not on any nutritional supplements at home but encouraged pt to drink them if his appetite goes down. Noted pt refused lunch and only ate 35% of dinner.   Height: Ht Readings from Last 1 Encounters:  07/02/12 6' 3.5" (1.918 m)    Weight: Wt Readings from Last 1 Encounters:  07/03/12 135 lb 9.3 oz (61.5 kg)    Ideal Body Weight: 196 lb  % Ideal Body Weight: 69  Wt Readings from Last 10 Encounters:  07/03/12 135 lb 9.3 oz (61.5 kg)  06/30/12 133 lb (60.328 kg)    Usual Body Weight: 138-139 lb per pt  % Usual Body Weight: 97-98  BMI:  Body mass index is 16.72 kg/(m^2). Underweight  Estimated Nutritional Needs: Kcal: 2450-2650 Protein: 115-135g Fluid: 2.4-2.6L/day  Skin: Intact   Diet Order: General  EDUCATION NEEDS: -No education needs identified at this time   Intake/Output Summary (Last 24 hours) at 07/03/12 1823 Last data filed at 07/03/12 1805  Gross per 24 hour  Intake   2445 ml  Output   1626 ml  Net    819 ml    Last BM: 6/12  Labs:   Recent Labs Lab 06/30/12 1037 07/01/12 0453 07/02/12 1605  NA 140 136 139  K 4.9 4.1 4.4  CL 102 102 102  CO2 29 27 27   BUN 14 14 12   CREATININE 1.26 1.15 1.10   CALCIUM 9.7 8.7 9.6  GLUCOSE 122* 92 94    CBG (last 3)  No results found for this basename: GLUCAP,  in the last 72 hours  Scheduled Meds: . ALPRAZolam  0.5 mg Oral TID WC  . cefTRIAXone (ROCEPHIN)  IV  1 g Intravenous Q24H  . diltiazem  120 mg Oral Daily  . Rivaroxaban  20 mg Oral Q supper  . simvastatin  5 mg Oral q1800    Continuous Infusions: . sodium chloride 1,000 mL (07/03/12 0437)    Past Medical History  Diagnosis Date  . Hypertension   . Hernia     L groin   . Hyperlipidemia   . Pre-diabetes   . A-fib     Past Surgical History  Procedure Laterality Date  . No past surgeries       Levon Hedger MS, RD, LDN [phone] Pager [phone] After Hours Pager

## 2012-07-03 NOTE — Evaluation (Signed)
Occupational Therapy Evaluation Patient Details Name: Shannon Kirby MRN: [id number] DOB: [birth year] Today's Date: 07/03/2012 Time: 4098-1191    OT Assessment / Plan / Recommendation Clinical Impression  Pt presents to OT with decreased I with ADL activity s/p admission to hospital. Pt will benefit from skilled OT to increase I with ADL activity and return to PLOF    OT Assessment  Patient needs continued OT Services    Follow Up Recommendations  Home health OT             Frequency  Min 2X/week    Precautions / Restrictions Precautions Precautions: Fall       ADL  Toilet Transfer: Simulated Toilet Transfer Method: Sit to stand;Other (comment) (from chair) Transfers/Ambulation Related to ADLs: Limited eval as pt fatigued and wanted to get back in bed as he had been complaining of no sleep. Pt did agree to stand EOB.  Pt did well with this but wanted to get back in bed.    OT Diagnosis: Generalized weakness  OT Problem List: Decreased strength;Decreased activity tolerance;Decreased safety awareness OT Treatment Interventions: Self-care/ADL training;Patient/family education   OT Goals Acute Rehab OT Goals OT Goal Formulation: With patient ADL Goals Pt Will Perform Grooming: with modified independence;Standing at sink ADL Goal: Grooming - Progress: Progressing toward goals Pt Will Transfer to Toilet: with supervision;Comfort height toilet ADL Goal: Toilet Transfer - Progress: Goal set today Pt Will Perform Toileting - Clothing Manipulation: with supervision;Standing ADL Goal: Toileting - Clothing Manipulation - Progress: Goal set today Pt Will Perform Toileting - Hygiene: with supervision;Standing at 3-in-1/toilet ADL Goal: Toileting - Hygiene - Progress: Goal set today  Visit Information  Last OT Received On: 07/03/12    Subjective Data  Subjective: I juist hadnt been able to sleep!    Prior Functioning     Home Living Lives With: Daughter Available Help at  Discharge: Family Type of Home: House Home Access: Stairs to enter Secretary/administrator of Steps: 4 Entrance Stairs-Rails: Left Home Layout: One level Bathroom Shower/Tub: Engineer, manufacturing systems: Standard Bathroom Accessibility: Yes Home Adaptive Equipment: Walker - rolling Prior Function Level of Independence: Independent Able to Take Stairs?: Yes Driving: Yes Vocation: Retired Comments: laid Scientist, research (medical) Communication: No difficulties Dominant Hand: Right         Vision/Perception Vision - History Baseline Vision: Wears glasses only for reading Patient Visual Report: No change from baseline Vision - Assessment Eye Alignment: Within Functional Limits   Cognition  Cognition Arousal/Alertness: Awake/alert Behavior During Therapy: WFL for tasks assessed/performed Overall Cognitive Status: Within Functional Limits for tasks assessed    Extremity/Trunk Assessment Right Upper Extremity Assessment RUE ROM/Strength/Tone: [facility] for tasks assessed Left Upper Extremity Assessment LUE ROM/Strength/Tone: WFL for tasks assessed     Mobility Bed Mobility Bed Mobility: Supine to Sit Supine to Sit: 5: Supervision;With rails;HOB elevated Transfers Transfers: Sit to Stand;Stand to Sit Sit to Stand: 4: Min guard;With upper extremity assist;From bed Stand to Sit: 4: Min guard;With upper extremity assist;To chair/3-in-1           End of Session OT - End of Session Activity Tolerance: Patient limited by fatigue Patient left: in bed;with call bell/phone within reach  GO     [facility] [place] Kung 07/03/2012, 11:21 AM

## 2012-07-03 NOTE — Care Management Note (Addendum)
    Page 1 of 1   07/04/2012     12:56:17 PM   CARE MANAGEMENT NOTE 07/04/2012  Patient:  Shannon Kirby, Shannon Kirby   Account Number:  [id number]  Date Initiated:  07/03/2012  Documentation initiated by:  Lanier Clam  Subjective/Objective Assessment:   ADMITTED W/SYNCOPE.NEW AFIB.     Action/Plan:   FROM HOME W/FAMILY.HAS RW.   Anticipated DC Date:  07/04/2012   Anticipated DC Plan:  HOME W HOME HEALTH SERVICES      DC Planning Services  CM consult      Choice offered to / List presented to:  C-1 Patient        HH arranged  HH-1 RN  HH-2 PT  HH-3 OT      [facility] agency  [facility]   Status of service:  Completed, signed off Medicare Important Message given?   (If response is "NO", the following Medicare IM given date fields will be blank) Date Medicare IM given:   Date Additional Medicare IM given:    Discharge Disposition:  HOME W HOME HEALTH SERVICES  Per UR Regulation:  Reviewed for med. necessity/level of care/duration of stay  If discussed at Long Length of Stay Meetings, dates discussed:    Comments:  07/04/12 Malacai Grantz RN,BSN NCM [phone] [facility] CHOSEN SPOKE TO KRISTEN REP.INFORMED OF D/C HOME W/HH ORDERS.  07/03/12 Jessia Kief RN,BSN NCM [phone] PT/OT-HH.PROVIDED [facility] AGENCY LIST.

## 2012-07-04 DIAGNOSIS — I10 Essential (primary) hypertension: Secondary | ICD-10-CM

## 2012-07-04 LAB — URINE CULTURE
Colony Count: NO GROWTH
Culture: NO GROWTH

## 2012-07-04 LAB — CBC
HCT: 41.1 % (ref 39.0–52.0)
Hemoglobin: 13.6 g/dL (ref 13.0–17.0)
MCH: 30.5 pg (ref 26.0–34.0)
MCHC: 33.1 g/dL (ref 30.0–36.0)
MCV: 92.2 fL (ref 78.0–100.0)
Platelets: 118 K/uL — ABNORMAL LOW (ref 150–400)
RBC: 4.46 MIL/uL (ref 4.22–5.81)
RDW: 12.5 % (ref 11.5–15.5)
WBC: 8.3 K/uL (ref 4.0–10.5)

## 2012-07-04 LAB — COMPREHENSIVE METABOLIC PANEL
Alkaline Phosphatase: 60 U/L (ref 39–117)
BUN: 9 mg/dL (ref 6–23)
Chloride: 106 mEq/L (ref 96–112)
Creatinine, Ser: 0.94 mg/dL (ref 0.50–1.35)
GFR calc Af Amer: 90 mL/min — ABNORMAL LOW (ref >90)
Glucose, Bld: 101 mg/dL — ABNORMAL HIGH (ref 70–99)
Potassium: 4.6 mEq/L (ref 3.5–5.1)
Total Bilirubin: 0.2 mg/dL — ABNORMAL LOW (ref 0.3–1.2)
Total Protein: 5.6 g/dL — ABNORMAL LOW (ref 6.0–8.3)

## 2012-07-04 LAB — TROPONIN I: Troponin I: <0.3 ng/mL (ref <0.30)

## 2012-07-04 MED ORDER — NICOTINE 14 MG/24HR TD PT24: 1.0000 | MEDICATED_PATCH | TRANSDERMAL | Status: DC

## 2012-07-04 NOTE — Discharge Summary (Signed)
Physician Discharge Summary  Shannon Kirby ZOX:[id number] DOB: [birth year] DOA: 07/02/2012  PCP: Mickie Hillier, MD  Admit date: 07/02/2012 Discharge date: 07/04/2012  Time spent: 35 minutes  Recommendations for Outpatient Follow-up:  1. CBC 1 week re plt 2. Urology follow up 3. Wean off nicotine patch  Discharge Diagnoses:  Active Problems:   Atrial fibrillation   HTN (hypertension), benign   Orthostatic hypotension   UTI (lower urinary tract infection)   Near syncope   Discharge Condition: improved  Diet recommendation: cardiac  Filed Weights   07/02/12 2111 07/03/12 0500 07/04/12 0511  Weight: 61.054 kg (134 lb 9.6 oz) 61.5 kg (135 lb 9.3 oz) 61.2 kg (134 lb 14.7 oz)    History of present illness:  Past medical history of hypertension, newly diagnosed A. fib for for which he was discharged and 07/01/2012 on Cardizem and Xarelto also with acute urinary retention sent home with a Foley comes in for near syncope on the morning of admission. He relates he has been getting dizzy the last 12 hours every time he tries to stand up. AThis afternoon he tried to stand up and he was just about to fall and did not hit his head as his daughter caught him. She relates he was white, sweaty and he decribes palpitations so he was brought here to the emergency room.  - He denies any fever chills nausea vomiting or diarrhea, cough or sick contacts.  In the ED:  An EKG was done that showed atrial fibrillation rate controlled with a right bundle branch block unchanged from previous. A UA was done that showed large amounts of white blood cells had a few bacteria, orthostatics were checked which were positive, so we were asked to admit and further evaluate   Hospital Course:  Near syncope due to Orthostatic hypotension:  - Likely cause of his orthostatic hypotension is probably a urinary tract infection as he has large amounts of l white blood cells in his urine. He is nonseptic appearing, has no  fever or leukocytosis. In the emergency room his orthostatics were positive. -  Culture NGTD - blood cultures.   UTI (lower urinary tract infection): UTI due to Indwelling Catheter  - needs to follow up with urology  Atrial fibrillation:  - Currently rate controlled continue current home medications. Cardizem and Xarelto  -brady while sleeping- no symptoms   HTN (hypertension), benign:  - He's only on diltiazem for hypertension. We'll continue these medications as needed for each relation. Currently not on a diuretic at home.   Thrombocytopenia:  -monitor as outpatient with CBC     Discharge Exam: Filed Vitals:   07/03/12 1046 07/03/12 1434 07/03/12 2044 07/04/12 0511  BP: 105/60 102/54 112/72 106/54  Pulse: 98 69 90 71  Temp: 97.9 F (36.6 C) 98.1 F (36.7 C) 98.4 F (36.9 C) 97.3 F (36.3 C)  TempSrc: Oral  Oral Oral  Resp: 18  18 18   Height:      Weight:    61.2 kg (134 lb 14.7 oz)  SpO2: 95% 99% 97% 97%    General: A+Ox3, NAD Cardiovascular: rrr Respiratory: clear anterior  Discharge Instructions  Discharge Orders   Future Orders Complete By Expires     Diet - low sodium heart healthy  As directed     Discharge instructions  As directed     Comments:      Home health PT/OT/RN PCP to wean off nicotine patch- do not smoke while using patch D/c with foley-  patient to follow up with urology- from last d/c    Increase activity slowly  As directed         Medication List    TAKE these medications       ALPRAZolam 1 MG tablet  Commonly known as:  XANAX  Take 0.5-1 mg by mouth 4 (four) times daily - after meals and at bedtime. 0.5mg  tid and 1mg  qhs     diltiazem 120 MG 24 hr capsule  Commonly known as:  CARDIZEM CD  Take 1 capsule (120 mg total) by mouth daily.     nicotine 14 mg/24hr patch  Commonly known as:  NICODERM CQ - dosed in mg/24 hours  Place 1 patch onto the skin daily.     ondansetron 4 MG disintegrating tablet  Commonly known as:   ZOFRAN-ODT  Take 1 tablet (4 mg total) by mouth every 8 (eight) hours as needed for nausea.     pravastatin 10 MG tablet  Commonly known as:  PRAVACHOL  Take 10 mg by mouth every evening.     Rivaroxaban 20 MG Tabs  Commonly known as:  XARELTO  Take 1 tablet (20 mg total) by mouth daily with supper.       No Known Allergies     Follow-up Information   Follow up with Mickie Hillier, MD In 1 week.   Contact information:   [address] [place] [address] [phone]       Please follow up. (with urology from last admission)        The results of significant diagnostics from this hospitalization (including imaging, microbiology, ancillary and laboratory) are listed below for reference.    Significant Diagnostic Studies: Dg Chest 2 View  07/02/2012   *RADIOLOGY REPORT*  Clinical Data: Hypotension, syncope, and weakness  CHEST - 2 VIEW  Comparison: Chest radiograph 06/30/2012  Findings: Normal mediastinum and cardiac silhouette.  Normal pulmonary  vasculature.  No evidence of effusion, infiltrate, or pneumothorax.  No acute bony abnormality. Degenerative osteophytosis of the thoracic spine.  IMPRESSION: No acute cardiopulmonary process.   Original Report Authenticated By: Genevive Bi, M.D.   Dg Chest Port 1 View  07/03/2012   *RADIOLOGY REPORT*  Clinical Data: Nausea vomiting weakness.  Short of breath  PORTABLE CHEST - 1 VIEW  Comparison: 07/02/2012  Findings: COPD with pulmonary hyperinflation and hyperlucency. Mild scarring in the lung bases.  Negative for pneumonia.  Negative for heart failure or mass lesion.  IMPRESSION: COPD without acute abnormality.   Original Report Authenticated By: Janeece Riggers, M.D.   Dg Chest Port 1 View  06/30/2012   *RADIOLOGY REPORT*  Clinical Data: Dysuria, difficulty urinating, dizziness, history asthma, hypertension  PORTABLE CHEST - 1 VIEW  Comparison: Portable exam 1025 hours compared to 11/09/2011  Findings: Normal heart size,  mediastinal contours, and pulmonary vascularity. Skin folds project over chest bilaterally. Emphysematous and bronchitic changes consistent with COPD. Minimal atelectasis at right base. No acute infiltrate, pleural effusion or pneumothorax. Bones appear demineralized.  IMPRESSION: COPD changes with minimal right basilar atelectasis. No acute infiltrate.   Original Report Authenticated By: Ulyses Southward, M.D.   Dg Abd Portable 2v  07/03/2012   *RADIOLOGY REPORT*  Clinical Data: Nausea vomiting and weakness.  PORTABLE ABDOMEN - 2 VIEW  Comparison: None  Findings: Negative for bowel obstruction.  No free air.  No acute bony abnormality.  No kidney stones.  Vascular calcifications in the pelvis.  IMPRESSION: No acute abnormality.   Original Report Authenticated  By: Janeece Riggers, M.D.    Microbiology: Recent Results (from the past 240 hour(s))  URINE CULTURE     Status: None   Collection Time    07/02/12  4:08 PM      Result Value Range Status   Specimen Description URINE, CATHETERIZED   Final   Special Requests NONE   Final   Culture  Setup Time 07/03/2012 05:00   Final   Colony Count NO GROWTH   Final   Culture NO GROWTH   Final   Report Status 07/04/2012 FINAL   Final     Labs: Basic Metabolic Panel:  Recent Labs Lab 06/30/12 1037 07/01/12 0453 07/02/12 1605 07/04/12 0519  NA 140 136 139 141  K 4.9 4.1 4.4 4.6  CL 102 102 102 106  CO2 29 27 27 29   GLUCOSE 122* 92 94 101*  BUN 14 14 12 9   CREATININE 1.26 1.15 1.10 0.94  CALCIUM 9.7 8.7 9.6 8.7   Liver Function Tests:  Recent Labs Lab 07/01/12 0453 07/02/12 1605 07/04/12 0519  AST 12 15 14   ALT 10 10 11   ALKPHOS 62 73 60  BILITOT 0.7 0.6 0.2*  PROT 5.8* 6.8 5.6*  ALBUMIN 3.3* 3.9 3.0*   No results found for this basename: LIPASE, AMYLASE,  in the last 168 hours No results found for this basename: AMMONIA,  in the last 168 hours CBC:  Recent Labs Lab 06/30/12 1037 07/01/12 0453 07/02/12 1605 07/03/12 0516  07/04/12 0519  WBC 9.5 11.1* 8.9 9.1 8.3  NEUTROABS  --   --  6.0  --   --   HGB 16.9 14.6 15.8 13.4 13.6  HCT 48.8 42.8 45.9 40.7 41.1  MCV 92.4 92.4 91.6 92.5 92.2  PLT 149* 137* 124* 119* 118*   Cardiac Enzymes:  Recent Labs Lab 07/03/12 1342 07/03/12 1900 07/04/12 0010  TROPONINI <0.30 <0.30 <0.30   BNP: BNP (last 3 results) No results found for this basename: PROBNP,  in the last 8760 hours CBG: No results found for this basename: GLUCAP,  in the last 168 hours     Signed:  Marlin Canary  [facility] 07/04/2012, 9:27 AM

## 2012-07-04 NOTE — Plan of Care (Signed)
Problem: Phase II Progression Outcomes Goal: Obtain order to discontinue catheter if appropriate Outcome: Not Applicable Date Met:  07/04/12 Pt admitted with foley catheter in place d/t urinary retention

## 2012-07-04 NOTE — Progress Notes (Signed)
PT Cancellation Note  Patient Details Name: CASHTON HOSLEY MRN: [id number] DOB: [birth year]   Cancelled Treatment:     Pt reports d/c home today and did not wish to do PT or practice anything prior to d/c.  States getting up to bathroom without dizziness.     Makya Phillis,KATHrine E 07/04/2012, 1:18 PM Zenovia Jarred, PT, DPT 07/04/2012 Pager: [phone]

## 2012-07-08 ENCOUNTER — Inpatient Hospital Stay (HOSPITAL_COMMUNITY)
Admission: EM | Admit: 2012-07-08 | Discharge: 2012-07-14 | DRG: 064 | Disposition: A | Discharge status: Home Health | Payer: No Typology Code available for payment source | Attending: Internal Medicine | Admitting: Internal Medicine

## 2012-07-08 ENCOUNTER — Encounter (HOSPITAL_COMMUNITY): Payer: Self-pay | Admitting: Emergency Medicine

## 2012-07-08 ENCOUNTER — Emergency Department (HOSPITAL_COMMUNITY): Payer: No Typology Code available for payment source

## 2012-07-08 ENCOUNTER — Other Ambulatory Visit: Payer: Self-pay

## 2012-07-08 DIAGNOSIS — I4891 Unspecified atrial fibrillation: Secondary | ICD-10-CM

## 2012-07-08 DIAGNOSIS — R279 Unspecified lack of coordination: Secondary | ICD-10-CM | POA: Diagnosis present

## 2012-07-08 DIAGNOSIS — R000 Tachycardia, unspecified: Secondary | ICD-10-CM | POA: Diagnosis not present

## 2012-07-08 DIAGNOSIS — R339 Retention of urine, unspecified: Secondary | ICD-10-CM | POA: Diagnosis present

## 2012-07-08 DIAGNOSIS — I6350 Cerebral infarction due to unspecified occlusion or stenosis of unspecified cerebral artery: Principal | ICD-10-CM | POA: Diagnosis present

## 2012-07-08 DIAGNOSIS — R4789 Other speech disturbances: Secondary | ICD-10-CM | POA: Diagnosis present

## 2012-07-08 DIAGNOSIS — Z800 Family history of malignant neoplasm of digestive organs: Secondary | ICD-10-CM

## 2012-07-08 DIAGNOSIS — K922 Gastrointestinal hemorrhage, unspecified: Secondary | ICD-10-CM | POA: Clinically undetermined

## 2012-07-08 DIAGNOSIS — R338 Other retention of urine: Secondary | ICD-10-CM

## 2012-07-08 DIAGNOSIS — R7309 Other abnormal glucose: Secondary | ICD-10-CM | POA: Diagnosis present

## 2012-07-08 DIAGNOSIS — F17200 Nicotine dependence, unspecified, uncomplicated: Secondary | ICD-10-CM | POA: Diagnosis present

## 2012-07-08 DIAGNOSIS — K648 Other hemorrhoids: Secondary | ICD-10-CM | POA: Diagnosis present

## 2012-07-08 DIAGNOSIS — I10 Essential (primary) hypertension: Secondary | ICD-10-CM

## 2012-07-08 DIAGNOSIS — K5730 Diverticulosis of large intestine without perforation or abscess without bleeding: Secondary | ICD-10-CM | POA: Diagnosis present

## 2012-07-08 DIAGNOSIS — E43 Unspecified severe protein-calorie malnutrition: Secondary | ICD-10-CM

## 2012-07-08 DIAGNOSIS — N138 Other obstructive and reflux uropathy: Secondary | ICD-10-CM | POA: Diagnosis present

## 2012-07-08 DIAGNOSIS — N400 Enlarged prostate without lower urinary tract symptoms: Secondary | ICD-10-CM

## 2012-07-08 DIAGNOSIS — F419 Anxiety disorder, unspecified: Secondary | ICD-10-CM

## 2012-07-08 DIAGNOSIS — Z7901 Long term (current) use of anticoagulants: Secondary | ICD-10-CM

## 2012-07-08 DIAGNOSIS — I639 Cerebral infarction, unspecified: Secondary | ICD-10-CM

## 2012-07-08 DIAGNOSIS — K626 Ulcer of anus and rectum: Secondary | ICD-10-CM | POA: Diagnosis not present

## 2012-07-08 DIAGNOSIS — Z681 Body mass index (BMI) 19 or less, adult: Secondary | ICD-10-CM

## 2012-07-08 DIAGNOSIS — Z79899 Other long term (current) drug therapy: Secondary | ICD-10-CM

## 2012-07-08 DIAGNOSIS — E785 Hyperlipidemia, unspecified: Secondary | ICD-10-CM

## 2012-07-08 DIAGNOSIS — N401 Enlarged prostate with lower urinary tract symptoms: Secondary | ICD-10-CM | POA: Diagnosis present

## 2012-07-08 DIAGNOSIS — K921 Melena: Secondary | ICD-10-CM

## 2012-07-08 DIAGNOSIS — I6789 Other cerebrovascular disease: Secondary | ICD-10-CM | POA: Diagnosis present

## 2012-07-08 HISTORY — DX: Cerebral infarction, unspecified: I63.9

## 2012-07-08 LAB — COMPREHENSIVE METABOLIC PANEL
ALT: 16 U/L (ref 0–53)
BUN: 18 mg/dL (ref 6–23)
CO2: 34 mEq/L — ABNORMAL HIGH (ref 19–32)
Calcium: 9.8 mg/dL (ref 8.4–10.5)
Creatinine, Ser: 1.16 mg/dL (ref 0.50–1.35)
GFR calc Af Amer: 67 mL/min — ABNORMAL LOW (ref >90)
GFR calc non Af Amer: 58 mL/min — ABNORMAL LOW (ref >90)
Glucose, Bld: 77 mg/dL (ref 70–99)
Sodium: 139 mEq/L (ref 135–145)
Total Protein: 6.7 g/dL (ref 6.0–8.3)

## 2012-07-08 LAB — URINALYSIS, ROUTINE W REFLEX MICROSCOPIC
Bilirubin Urine: NEGATIVE
Glucose, UA: NEGATIVE mg/dL
Leukocytes, UA: NEGATIVE
Nitrite: NEGATIVE
Specific Gravity, Urine: 1.01 (ref 1.005–1.030)
pH: 7 (ref 5.0–8.0)

## 2012-07-08 LAB — DIFFERENTIAL
Eosinophils Absolute: 0.6 10*3/uL (ref 0.0–0.7)
Eosinophils Relative: 5 % (ref 0–5)
Lymphocytes Relative: 14 % (ref 12–46)
Lymphs Abs: 1.7 10*3/uL (ref 0.7–4.0)
Monocytes Absolute: 1.2 10*3/uL — ABNORMAL HIGH (ref 0.1–1.0)
Monocytes Relative: 10 % (ref 3–12)

## 2012-07-08 LAB — CBC
HCT: 44.3 % (ref 39.0–52.0)
MCH: 32.7 pg (ref 26.0–34.0)
MCV: 92.9 fL (ref 78.0–100.0)
Platelets: 161 10*3/uL (ref 150–400)
RBC: 4.77 MIL/uL (ref 4.22–5.81)
WBC: 12 10*3/uL — ABNORMAL HIGH (ref 4.0–10.5)

## 2012-07-08 LAB — POCT I-STAT, CHEM 8
Calcium, Ion: 1.16 mmol/L (ref 1.13–1.30)
Chloride: 99 mEq/L (ref 96–112)
HCT: 46 % (ref 39.0–52.0)
Sodium: 141 mEq/L (ref 135–145)
TCO2: 34 mmol/L (ref 0–100)

## 2012-07-08 LAB — TROPONIN I: Troponin I: <0.3 ng/mL (ref <0.30)

## 2012-07-08 LAB — URINE MICROSCOPIC-ADD ON

## 2012-07-08 LAB — GLUCOSE, CAPILLARY

## 2012-07-08 LAB — PROTIME-INR: Prothrombin Time: 29.1 seconds — ABNORMAL HIGH (ref 11.6–15.2)

## 2012-07-08 LAB — POCT I-STAT TROPONIN I: Troponin i, poc: 0.02 ng/mL (ref 0.00–0.08)

## 2012-07-08 LAB — RAPID URINE DRUG SCREEN, HOSP PERFORMED
Barbiturates: NOT DETECTED
Benzodiazepines: POSITIVE — AB
Cocaine: NOT DETECTED

## 2012-07-08 MED ORDER — RIVAROXABAN 20 MG PO TABS
20.0000 mg | ORAL_TABLET | Freq: Every day | ORAL | Status: DC
  Administered 2012-07-09: 20 mg via ORAL
  Filled 2012-07-08 (×2): qty 1

## 2012-07-08 MED ORDER — TAMSULOSIN HCL 0.4 MG PO CAPS
0.4000 mg | ORAL_CAPSULE | Freq: Every day | ORAL | Status: DC
  Administered 2012-07-09 – 2012-07-13 (×5): 0.4 mg via ORAL
  Filled 2012-07-08 (×6): qty 1

## 2012-07-08 MED ORDER — FINASTERIDE 5 MG PO TABS
5.0000 mg | ORAL_TABLET | Freq: Every day | ORAL | Status: DC
  Administered 2012-07-09 – 2012-07-14 (×6): 5 mg via ORAL
  Filled 2012-07-08 (×6): qty 1

## 2012-07-08 MED ORDER — ASPIRIN 300 MG RE SUPP
300.0000 mg | Freq: Every day | RECTAL | Status: DC
  Filled 2012-07-08: qty 1

## 2012-07-08 MED ORDER — SENNOSIDES-DOCUSATE SODIUM 8.6-50 MG PO TABS
1.0000 | ORAL_TABLET | Freq: Every evening | ORAL | Status: DC | PRN
Start: 2012-07-08 — End: 2012-07-14

## 2012-07-08 MED ORDER — DILTIAZEM HCL ER COATED BEADS 120 MG PO CP24
120.0000 mg | ORAL_CAPSULE | Freq: Every day | ORAL | Status: DC
  Administered 2012-07-09 – 2012-07-14 (×6): 120 mg via ORAL
  Filled 2012-07-08 (×6): qty 1

## 2012-07-08 MED ORDER — ASPIRIN 325 MG PO TABS
325.0000 mg | ORAL_TABLET | Freq: Every day | ORAL | Status: DC
  Administered 2012-07-09: 325 mg via ORAL
  Filled 2012-07-08: qty 1

## 2012-07-08 MED ORDER — SODIUM CHLORIDE 0.9 % IV SOLN
INTRAVENOUS | Status: DC
  Administered 2012-07-08 – 2012-07-09 (×2): via INTRAVENOUS
  Administered 2012-07-10: 20 mL via INTRAVENOUS
  Administered 2012-07-10: via INTRAVENOUS
  Administered 2012-07-11: 500 mL via INTRAVENOUS

## 2012-07-08 MED ORDER — ACETAMINOPHEN 650 MG RE SUPP: 650.0000 mg | RECTAL | Status: DC | PRN

## 2012-07-08 MED ORDER — ACETAMINOPHEN 325 MG PO TABS: 650.0000 mg | ORAL_TABLET | ORAL | Status: DC | PRN

## 2012-07-08 MED ORDER — ALPRAZOLAM 0.5 MG PO TABS
0.5000 mg | ORAL_TABLET | Freq: Three times a day (TID) | ORAL | Status: DC
  Filled 2012-07-08: qty 2

## 2012-07-08 MED ORDER — SIMVASTATIN 5 MG PO TABS
5.0000 mg | ORAL_TABLET | Freq: Every day | ORAL | Status: DC
  Administered 2012-07-09 – 2012-07-13 (×5): 5 mg via ORAL
  Filled 2012-07-08 (×6): qty 1

## 2012-07-08 NOTE — ED Notes (Signed)
Pt presenting from home with reported slurred speech, confusion and an unsteady gait per family.  Upon arrival to ER, symptoms have resolved.  NIH 1.  LSN per family 1515, arrival to ED 1803, EDP exam at 1818, code stroke called at 1819, pt taken to CT at 1823, phlebotomist arrived at 1822, stroke team and neurologist arrived at 68.  Code stroke downgraded at 1848 due to pt being on Xarelto and symptoms resolving.

## 2012-07-08 NOTE — H&P (Signed)
PCP:  Mickie Hillier, MD  Urology: Berneice Heinrich  Chief Complaint:   Slurred speech  HPI: Shannon Kirby is a 79 y.o. male   has a past medical history of Hypertension; Hernia; Hyperlipidemia; Pre-diabetes; and A-fib.   Presented with   1 week hx of urinary retention he was admitted to [facility] and had a foley place discharged to home. At that time he was also diagnosed with UTI and treated with 3 day course.  He was readmitted for dehydration and was found to be in A.fib and started xarelto. Today patient went to urology for follow up and was strated on proscar and repaflo. He went home and took a nap at 3 pm he woke up at 4:30 with slurred speech, ataxia, confusion, falling over. He was taken to [facility] ED. Code stroke was called but he was not a candidate for tPA due to being on xarelto. CT scan showed 4 mm hypodensity in the right thalamus likely reflects a lacunar infarction. Currently his slurred speech is still there.    Review of Systems:   Pertinent positives include:   gait abnormality,  slurred speech,  confusion   Constitutional:  No weight loss, night sweats, Fevers, chills, fatigue, weight loss  HEENT:  No headaches, Difficulty swallowing,Tooth/dental problems,Sore throat,  No sneezing, itching, ear ache, nasal congestion, post nasal drip,  Cardio-vascular:  No chest pain, Orthopnea, PND, anasarca, dizziness, palpitations.no Bilateral lower extremity swelling  GI:  No heartburn, indigestion, abdominal pain, nausea, vomiting, diarrhea, change in bowel habits, loss of appetite, melena, blood in stool, hematemesis Resp:  no shortness of breath at rest. No dyspnea on exertion, No excess mucus, no productive cough, No non-productive cough, No coughing up of blood.No change in color of mucus.No wheezing. Skin:  no rash or lesions. No jaundice GU:  no dysuria, change in color of urine, no urgency or frequency. No straining to urinate.  No flank pain.  Musculoskeletal:  No joint pain or no  joint swelling. No decreased range of motion. No back pain.  Psych:  No change in mood or affect. No depression or anxiety. No memory loss.  Neuro: no localizing neurological complaints, no tingling, no weakness, no double vision, Otherwise ROS are negative except for above, 10 systems were reviewed  Past Medical History: Past Medical History  Diagnosis Date  . Hypertension   . Hernia     L groin   . Hyperlipidemia   . Pre-diabetes   . A-fib    Past Surgical History  Procedure Laterality Date  . No past surgeries       Medications: Prior to Admission medications   Medication Sig Start Date End Date Taking? Authorizing Provider  ALPRAZolam Prudy Feeler) 1 MG tablet Take 0.5-1 mg by mouth 4 (four) times daily - after meals and at bedtime. 0.5mg  tid and 1mg  qhs   Yes Historical Provider, MD  diltiazem (CARDIZEM CD) 120 MG 24 hr capsule Take 1 capsule (120 mg total) by mouth daily. 07/01/12  Yes Osvaldo Shipper, MD  finasteride (PROSCAR) 5 MG tablet Take 5 mg by mouth daily. 07/08/12  Yes Historical Provider, MD  pravastatin (PRAVACHOL) 10 MG tablet Take 10 mg by mouth every evening.   Yes Historical Provider, MD  Rivaroxaban (XARELTO) 20 MG TABS Take 1 tablet (20 mg total) by mouth daily with supper. 07/01/12  Yes Osvaldo Shipper, MD  silodosin (RAPAFLO) 8 MG CAPS capsule Take 8 mg by mouth daily with breakfast.   Yes Historical Provider, MD  Allergies:  No Known Allergies  Social History:  Ambulatory   Independently  Lives at home with family   reports that he has been smoking.  He has never used smokeless tobacco. He reports that he does not drink alcohol or use illicit drugs.   Family History: family history includes Colon cancer in his father; Heart attack in his sister; and Other in his mother.    Physical Exam: Patient Vitals for the past 24 hrs:  BP Temp Temp src Pulse Resp SpO2  07/08/12 2100 129/76 mmHg - - 83 21 98 %  07/08/12 2033 130/71 mmHg - - 87 - 96 %  07/08/12  1900 126/73 mmHg - - 92 24 98 %  07/08/12 1845 118/50 mmHg - - 101 22 98 %  07/08/12 1815 107/56 mmHg - - 40 28 100 %  07/08/12 1807 127/69 mmHg 98.3 F (36.8 C) Oral 99 15 98 %    1. General:  in No Acute distress 2. Psychological: Alert and Oriented 3. Head/ENT:   Moist   Mucous Membranes                          Head Non traumatic, neck supple                          Normal   Dentition 4. SKIN: normal   Skin turgor,  Skin clean Dry and intact no rash 5. Heart:irregular rate and rhythm no Murmur, Rub or gallop 6. Lungs: Clear to auscultation bilaterally, no wheezes or crackles   7. Abdomen: Soft, non-tender, Non distended 8. Lower extremities: no clubbing, cyanosis, or edema 9. Neurologically his strength 5 in all 4 extremities cranial nerves II through XII intact  10. MSK: Normal range of motion  body mass index is unknown because there is no weight on file.   Labs on Admission:   Recent Labs  07/08/12 1828 07/08/12 1911  NA 139 141  K 4.4 4.3  CL 100 99  CO2 34*  --   GLUCOSE 77 76  BUN 18 17  CREATININE 1.16 1.20  CALCIUM 9.8  --     Recent Labs  07/08/12 1828  AST 20  ALT 16  ALKPHOS 74  BILITOT 0.3  PROT 6.7  ALBUMIN 3.9   No results found for this basename: LIPASE, AMYLASE,  in the last 72 hours  Recent Labs  07/08/12 1828 07/08/12 1911  WBC 12.0*  --   NEUTROABS 8.4*  --   HGB 15.6 15.6  HCT 44.3 46.0  MCV 92.9  --   PLT 161  --     Recent Labs  07/08/12 1852  TROPONINI <0.30   No results found for this basename: TSH, T4TOTAL, FREET3, T3FREE, THYROIDAB,  in the last 72 hours No results found for this basename: VITAMINB12, FOLATE, FERRITIN, TIBC, IRON, RETICCTPCT,  in the last 72 hours Lab Results  Component Value Date   HGBA1C 5.8* 06/30/2012    The CrCl is unknown because both a height and weight (above a minimum accepted value) are required for this calculation. ABG    Component Value Date/Time   TCO2 34 07/08/2012 1911     No  results found for this basename: DDIMER     Other results:  I have pearsonaly reviewed this: ECG REPORT  Rate: 106  Rhythm: a.fib with RVR ST&T Change: no ischemic changes, no change from prior ECG  UA  no evidence of UTI   Cultures:    Component Value Date/Time   SDES URINE, CATHETERIZED 07/02/2012 1608   SPECREQUEST NONE 07/02/2012 1608   CULT NO GROWTH 07/02/2012 1608   REPTSTATUS 07/04/2012 FINAL 07/02/2012 1608       Radiological Exams on Admission: Ct Head Wo Contrast  07/08/2012   *RADIOLOGY REPORT*  Clinical Data: Slurred speech and confusion with unsteady gait. History of hypertension and atrial fibrillation.  CT HEAD WITHOUT CONTRAST  Technique:  Contiguous axial images were obtained from the base of the skull through the vertex without contrast.  Comparison: None.  Findings: No acute intracranial abnormality is identified. Specifically, no hemorrhage, hydrocephalus, mass effect, mass lesion, or evidence of acute cortically based infarction is identified.  4 mm right thalamic hypodensity likely reflects a prior lacunar infarction.  No prior imaging studies for comparison.  Mild proptosis bilaterally.  Skull intact.  Visualized paranasal sinuses and mastoid air cells clear.  IMPRESSION:  1. 4 mm hypodensity in the right thalamus likely reflects a lacunar infarction.  Age of the infarction is uncertain 2. Negative for hemorrhage, hydrocephalus, midline shift, or evidence of acute cortically-based large territory infarction.  Findings discussed with Dr. Roseanne Reno of neurology 6:43 p.m. 07/08/2012.   Original Report Authenticated By: Britta Mccreedy, M.D.    Chart has been reviewed  Assessment/Plan  79 year old gentleman with history of hypertension recent diagnosis of atrial fibrillation on Xarelto here with slurred speech and CT scan worrisome for CVA  Present on Admission:  . CVA (cerebral infarction)  -  - will admit based on TIA/CVA protocol, await results of MRA/MRI, Carotid  Doppler and Echo, obtain cardiac enzymes,  ECG, Lipid panel, TSH. Order PT/OT evaluation. Will make sure patient is on antiplatelet agent. Continue Xarelto.   Neurology is aware of the patient.  Marland Kitchen HTN (hypertension), benign - continue home medication  . Atrial fibrillation - heart rate now is under control continue home meds including Xarelto and diltiazem  . Acute urinary retention - continue Foley cath    Prophylaxis: xarelto Protonix  CODE STATUS: FULL CODE  Other plan as per orders.  I have spent a total of 55 min on this admission  Kaydenn Mclear 07/08/2012, 10:25 PM

## 2012-07-08 NOTE — Consult Note (Signed)
Referring Physician: Dr. Manus Gunning    Chief Complaint: slurred speech  HPI: Shannon Kirby is an 79 y.o. male who was normal when he laid down for a nap at 1515 today, 07/08/2012. When he woke up at 1630 on 07/08/2012, he was noted to have slurred speech and appeared confused. Family called EMS.  Patient has been in hospital recently. Has had UTI, orthostatic hypotension, atrial fibrillation and was put on xarelto around 06/30/2012. Has indwelling catheter.  Date last known well: Date: 07/08/2012 Time last known well: Time: 15:15  tPA Given: No: symptoms quickly resolving.   Past Medical History  Diagnosis Date  . Hypertension   . Hernia     L groin   . Hyperlipidemia   . Pre-diabetes   . A-fib     Past Surgical History  Procedure Laterality Date  . No past surgeries      Family History  Problem Relation Age of Onset  . Other Mother     Passed at 72 of "blood clot in chest"  . Colon cancer Father     Passed at 72  . Heart attack Sister     Passed away 65   Social History:  reports that he has been smoking.  He has never used smokeless tobacco. He reports that he does not drink alcohol or use illicit drugs.  Allergies: No Known Allergies  No current facility-administered medications for this encounter.   Current Outpatient Prescriptions  Medication Sig Dispense Refill  . ALPRAZolam (XANAX) 1 MG tablet Take 0.5-1 mg by mouth 4 (four) times daily - after meals and at bedtime. 0.5mg  tid and 1mg  qhs      . diltiazem (CARDIZEM CD) 120 MG 24 hr capsule Take 1 capsule (120 mg total) by mouth daily.  30 capsule  1  . pravastatin (PRAVACHOL) 10 MG tablet Take 10 mg by mouth every evening.      . Rivaroxaban (XARELTO) 20 MG TABS Take 1 tablet (20 mg total) by mouth daily with supper.  30 tablet  1   ROS: History obtained from the patient and family  General ROS: negative for - chills, fatigue, fever, night sweats, weight gain or weight loss Psychological ROS: negative for -  behavioral disorder, hallucinations, memory difficulties, mood swings or suicidal ideation, +brief confusion resolved. Ophthalmic ROS: negative for - blurry vision, double vision, eye pain or loss of vision ENT ROS: negative for - epistaxis, nasal discharge, oral lesions, sore throat, tinnitus or vertigo Allergy and Immunology ROS: negative for - hives or itchy/watery eyes Hematological and Lymphatic ROS: negative for - bleeding problems, bruising or swollen lymph nodes Endocrine ROS: negative for - galactorrhea, hair pattern changes, polydipsia/polyuria or temperature intolerance Respiratory ROS: negative for - cough, hemoptysis, shortness of breath or wheezing Cardiovascular ROS: negative for - chest pain, dyspnea on exertion, edema or irregular heartbeat Gastrointestinal ROS: negative for - abdominal pain, diarrhea, hematemesis, nausea/vomiting or stool incontinence Genito-Urinary ROS: negative for - dysuria, hematuria, incontinence or urinary frequency/urgency Musculoskeletal ROS: negative for - joint swelling or muscular weakness Neurological ROS: as noted in HPI Dermatological ROS: negative for rash and skin lesion changes   Physical Examination: Blood pressure 107/56, pulse 40, temperature 98.3 F (36.8 C), temperature source Oral, resp. rate 28, SpO2 100.00%.  Neurologic Examination: Mental Status: Alert, oriented, thought content appropriate.  Speech mild dysarthric.  Able to follow 3 step commands without difficulty. Cranial Nerves: II: visual fields grossly normal, pupils equal, round, reactive to light and accommodation III,IV,  VI: ptosis not present, extra-ocular motions intact bilaterally V,VII: smile symmetric, facial light touch sensation normal bilaterally VIII: hearing normal bilaterally IX,X: gag reflex present XI: trapezius strength/neck flexion strength normal bilaterally XII: tongue strength normal  Motor: Right : Upper extremity   5/5    Left:     Upper extremity    5/5  Lower extremity   5/5     Lower extremity   5/5 Tone and bulk:normal tone throughout; no atrophy noted Sensory: Pinprick and light touch intact throughout, bilaterally Deep Tendon Reflexes: 2+ and symmetric throughout Plantars: equivicol Cerebellar:  normal finger-to-nose, normal heel to shin test  Results for orders placed during the hospital encounter of 07/08/12 (from the past 48 hour(s))  CBC     Status: Abnormal   Collection Time    07/08/12  6:28 PM      Result Value Range   WBC 12.0 (*) 4.0 - 10.5 K/uL   RBC 4.77  4.22 - 5.81 MIL/uL   Hemoglobin 15.6  13.0 - 17.0 g/dL   HCT 82.9  56.2 - 13.0 %   MCV 92.9  78.0 - 100.0 fL   MCH 32.7  26.0 - 34.0 pg   MCHC 35.2  30.0 - 36.0 g/dL   RDW 86.5  78.4 - 69.6 %   Platelets 161  150 - 400 K/uL  DIFFERENTIAL     Status: Abnormal   Collection Time    07/08/12  6:28 PM      Result Value Range   Neutrophils Relative % 70  43 - 77 %   Neutro Abs 8.4 (*) 1.7 - 7.7 K/uL   Lymphocytes Relative 14  12 - 46 %   Lymphs Abs 1.7  0.7 - 4.0 K/uL   Monocytes Relative 10  3 - 12 %   Monocytes Absolute 1.2 (*) 0.1 - 1.0 K/uL   Eosinophils Relative 5  0 - 5 %   Eosinophils Absolute 0.6  0.0 - 0.7 K/uL   Basophils Relative 1  0 - 1 %   Basophils Absolute 0.1  0.0 - 0.1 K/uL  GLUCOSE, CAPILLARY     Status: None   Collection Time    07/08/12  6:41 PM      Result Value Range   Glucose-Capillary 88  70 - 99 mg/dL   Comment 1 Documented in Chart     Comment 2 Notify RN     Ct Head Wo Contrast 07/08/2012         A  1. 4 mm hypodensity in the right thalamus likely reflects a lacunar infarction.  Age of the infarction is uncertain 2. Negative for hemorrhage, hydrocephalus, midline shift, or evidence of acute cortically-based large territory infarction.   Assessment: 79 y.o. male  Was LKW at 1515, awoke at 1630 with slurred speech and transient acute delirium, symptoms improving. No sign of hemorrhage. Continue xarelto. Patient still in atrial  fibrillation. Will follow.  Stroke Risk Factors - atrial fibrillation, hyperlipidemia, hypertension and smoking  Plan: 1. HgbA1c, fasting lipid panel 2. MRI, MRA  of the brain without contrast 3. PT consult, OT consult, Speech consult 4. Echocardiogram 5. Carotid dopplers  6. Prophylactic therapy-continue the xarelto  7. Risk factor modification 8. Telemetry monitoring 9. Frequent neuro checks  Job Founds, MBA, MHA [facility] Pager [phone] 07/08/2012, 6:49 PM  I personally participated in this patient's evaluation and management, including formulating the above clinical impression and management recommendations.  Venetia Maxon M.D. [facility] [phone]

## 2012-07-08 NOTE — Code Documentation (Signed)
79 year old male brought to [facility] via [facility].  Arrived to ED at 1803.  EDP exam at 1818 - Code stroke called at 1819.  To CT scan at 1823.  Neuro team arrived at 1830.  Family states patient was LSW at 1515 when he laid down to take a nap.  When he awakened he was confused - speech was different - family describes it as "thick tongue" and he had an unsteady gait.  He has recent indwelling foley catheter for urinary retention and recent hospitalization for UTI - was seen at urology office today.  On arrival his NIHSS is 1 with mild dysarthria.  He is alert and oriented.  Patient is on Xarelto for atrial fibrillation.  Foley is patent - urine clear.  Dr. Lu Duffel present - symptoms resolving - still with mild dysarthria.  Handoff to [place], Charity fundraiser.

## 2012-07-08 NOTE — ED Provider Notes (Signed)
History     CSN: [id number]  Arrival date & time 07/08/12  1803   First MD Initiated Contact with Patient 07/08/12 1805      Chief Complaint  Patient presents with  . Altered Mental Status    (Consider location/radiation/quality/duration/timing/severity/associated sxs/prior treatment) HPI Comments: Patient presents from home with acute onset of slurred speech, confusion and difficulty walking 1-1/2 hours ago. Family states that his speech has changed. Patient denies any focal weakness, numbness or tingling. Denies any headache, chest shortness of breath. Xarelto history of atrial fibrillation. 2 recent admissions for atrial fibrillation orthostatic hypotension. Denies any dizziness, lightheadedness, abdominal pain, nausea vomiting  The history is provided by the patient.    Past Medical History  Diagnosis Date  . Hypertension   . Hernia     L groin   . Hyperlipidemia   . Pre-diabetes   . A-fib     Past Surgical History  Procedure Laterality Date  . No past surgeries      Family History  Problem Relation Age of Onset  . Other Mother     Passed at 38 of "blood clot in chest"  . Colon cancer Father     Passed at 76  . Heart attack Sister     Passed away 20    History  Substance Use Topics  . Smoking status: Current Every Day Smoker -- 1.00 packs/day for 63 years  . Smokeless tobacco: Never Used     Comment: Smoked since age 27  . Alcohol Use: No      Review of Systems  Constitutional: Negative for fever, activity change and appetite change.  HENT: Negative for congestion and rhinorrhea.   Respiratory: Negative for cough, chest tightness and shortness of breath.   Cardiovascular: Negative for chest pain.  Gastrointestinal: Negative for nausea, vomiting and abdominal pain.  Genitourinary: Negative for dysuria, urgency and hematuria.  Musculoskeletal: Negative for back pain.  Skin: Negative for rash.  Neurological: Positive for facial asymmetry, speech  difficulty and weakness. Negative for dizziness and headaches.  Psychiatric/Behavioral: Positive for altered mental status.  A complete 10 system review of systems was obtained and all systems are negative except as noted in the HPI and PMH.    Allergies  Review of patient's allergies indicates no known allergies.  Home Medications   Current Outpatient Rx  Name  Route  Sig  Dispense  Refill  . ALPRAZolam (XANAX) 1 MG tablet   Oral   Take 0.5-1 mg by mouth 4 (four) times daily - after meals and at bedtime. 0.5mg  tid and 1mg  qhs         . diltiazem (CARDIZEM CD) 120 MG 24 hr capsule   Oral   Take 1 capsule (120 mg total) by mouth daily.   30 capsule   1   . finasteride (PROSCAR) 5 MG tablet   Oral   Take 5 mg by mouth daily.         . pravastatin (PRAVACHOL) 10 MG tablet   Oral   Take 10 mg by mouth every evening.         . Rivaroxaban (XARELTO) 20 MG TABS   Oral   Take 1 tablet (20 mg total) by mouth daily with supper.   30 tablet   1   . silodosin (RAPAFLO) 8 MG CAPS capsule   Oral   Take 8 mg by mouth daily with breakfast.           BP 129/76  Pulse  83  Temp(Src) 98.3 F (36.8 C) (Oral)  Resp 21  SpO2 98%  Physical Exam  Constitutional: He is oriented to person, place, and time. He appears well-developed and well-nourished. No distress.  Slightly slurred speech  HENT:  Head: Normocephalic and atraumatic.  Mouth/Throat: Oropharynx is clear and moist.  Eyes: Conjunctivae and EOM are normal. Pupils are equal, round, and reactive to light.  Neck: Normal range of motion. Neck supple.  Cardiovascular: Normal rate and normal heart sounds.   No murmur heard. Irregular rhythm  Pulmonary/Chest: Effort normal and breath sounds normal. No respiratory distress.  Abdominal: Soft. There is no tenderness. There is no rebound and no guarding.  Genitourinary:  Indwelling Foley in place  Musculoskeletal: Normal range of motion. He exhibits no edema and no  tenderness.  Neurological: He is alert and oriented to person, place, and time. No cranial nerve deficit. He exhibits normal muscle tone. Coordination normal.  CN 2-12 intact, no ataxia on finger to nose, no nystagmus, 5/5 strength throughout, no pronator drift, Romberg negative, normal gait.   Skin: Skin is warm.    ED Course  Procedures (including critical care time)  Labs Reviewed  PROTIME-INR - Abnormal; Notable for the following:    Prothrombin Time 29.1 (*)    INR 2.94 (*)    All other components within normal limits  APTT - Abnormal; Notable for the following:    aPTT 46 (*)    All other components within normal limits  CBC - Abnormal; Notable for the following:    WBC 12.0 (*)    All other components within normal limits  DIFFERENTIAL - Abnormal; Notable for the following:    Neutro Abs 8.4 (*)    Monocytes Absolute 1.2 (*)    All other components within normal limits  COMPREHENSIVE METABOLIC PANEL - Abnormal; Notable for the following:    CO2 34 (*)    GFR calc non Af Amer 58 (*)    GFR calc Af Amer 67 (*)    All other components within normal limits  URINE RAPID DRUG SCREEN (HOSP PERFORMED) - Abnormal; Notable for the following:    Benzodiazepines POSITIVE (*)    All other components within normal limits  URINALYSIS, ROUTINE W REFLEX MICROSCOPIC - Abnormal; Notable for the following:    Hgb urine dipstick SMALL (*)    Protein, ur 30 (*)    All other components within normal limits  ETHANOL  TROPONIN I  GLUCOSE, CAPILLARY  URINE MICROSCOPIC-ADD ON  POCT I-STAT, CHEM 8  POCT I-STAT TROPONIN I   Ct Head Wo Contrast  07/08/2012   *RADIOLOGY REPORT*  Clinical Data: Slurred speech and confusion with unsteady gait. History of hypertension and atrial fibrillation.  CT HEAD WITHOUT CONTRAST  Technique:  Contiguous axial images were obtained from the base of the skull through the vertex without contrast.  Comparison: None.  Findings: No acute intracranial abnormality is  identified. Specifically, no hemorrhage, hydrocephalus, mass effect, mass lesion, or evidence of acute cortically based infarction is identified.  4 mm right thalamic hypodensity likely reflects a prior lacunar infarction.  No prior imaging studies for comparison.  Mild proptosis bilaterally.  Skull intact.  Visualized paranasal sinuses and mastoid air cells clear.  IMPRESSION:  1. 4 mm hypodensity in the right thalamus likely reflects a lacunar infarction.  Age of the infarction is uncertain 2. Negative for hemorrhage, hydrocephalus, midline shift, or evidence of acute cortically-based large territory infarction.  Findings discussed with Dr. Roseanne Reno of neurology  6:43 p.m. 07/08/2012.   Original Report Authenticated By: Britta Mccreedy, M.D.     1. CVA (cerebral infarction)       MDM  Acute onset of slurred speech, confusion, difficulty walking x1-1/2 hours. Code stroke called on arrival. Patient is not a TPA candidate secondary to xarelto use. Symptoms seem to be improving.  CT scan shows no hemorrhage. Hypodensity in the right thalamus possible lacunar infarction of unknown chronicity.  INR 2.9. Neurology at bedside feels patient needs MRI and workup for TIA.   Date: 07/08/2012  Rate: 106  Rhythm: atrial fibrillation  QRS Axis: normal  Intervals: normal  ST/T Wave abnormalities: nonspecific ST/T changes  Conduction Disutrbances:none  Narrative Interpretation:   Old EKG Reviewed: unchanged         Glynn Octave, MD 07/08/12 2307

## 2012-07-08 NOTE — ED Notes (Signed)
Pt from home c/o altered mental status per family. EMS states family noticed slurred speech and confusion this morning. No deficients noted by EMS. Pt alert and oriented x 4

## 2012-07-08 NOTE — ED Notes (Signed)
CBG 88 

## 2012-07-09 ENCOUNTER — Inpatient Hospital Stay (HOSPITAL_COMMUNITY): Payer: No Typology Code available for payment source

## 2012-07-09 DIAGNOSIS — N400 Enlarged prostate without lower urinary tract symptoms: Secondary | ICD-10-CM

## 2012-07-09 DIAGNOSIS — E785 Hyperlipidemia, unspecified: Secondary | ICD-10-CM

## 2012-07-09 DIAGNOSIS — E43 Unspecified severe protein-calorie malnutrition: Secondary | ICD-10-CM | POA: Insufficient documentation

## 2012-07-09 LAB — HEMOGLOBIN A1C: Hgb A1c MFr Bld: 5.7 % — ABNORMAL HIGH (ref <5.7)

## 2012-07-09 LAB — LIPID PANEL
Cholesterol: 133 mg/dL (ref 0–200)
VLDL: 30 mg/dL (ref 0–40)

## 2012-07-09 MED ORDER — POLYETHYLENE GLYCOL 3350 17 G PO PACK
17.0000 g | PACK | Freq: Every day | ORAL | Status: DC
  Administered 2012-07-12: 17 g via ORAL
  Filled 2012-07-09 (×5): qty 1

## 2012-07-09 MED ORDER — ENSURE COMPLETE PO LIQD
237.0000 mL | Freq: Two times a day (BID) | ORAL | Status: DC
  Administered 2012-07-09 – 2012-07-14 (×10): 237 mL via ORAL

## 2012-07-09 MED ORDER — NICOTINE 7 MG/24HR TD PT24
7.0000 mg | MEDICATED_PATCH | Freq: Every day | TRANSDERMAL | Status: DC
  Administered 2012-07-09 – 2012-07-14 (×6): 7 mg via TRANSDERMAL
  Filled 2012-07-09 (×6): qty 1

## 2012-07-09 MED ORDER — ONDANSETRON HCL 4 MG PO TABS
4.0000 mg | ORAL_TABLET | Freq: Four times a day (QID) | ORAL | Status: DC | PRN
  Administered 2012-07-09 – 2012-07-11 (×7): 4 mg via ORAL
  Filled 2012-07-09 (×8): qty 1

## 2012-07-09 MED ORDER — ALPRAZOLAM 0.5 MG PO TABS
0.5000 mg | ORAL_TABLET | Freq: Three times a day (TID) | ORAL | Status: DC
  Administered 2012-07-09 (×2): 0.5 mg via ORAL
  Administered 2012-07-09 (×2): 1 mg via ORAL
  Administered 2012-07-09: 0.5 mg via ORAL
  Administered 2012-07-10: 1 mg via ORAL
  Administered 2012-07-10 – 2012-07-11 (×5): 0.5 mg via ORAL
  Administered 2012-07-11: 1 mg via ORAL
  Administered 2012-07-12 (×3): 0.5 mg via ORAL
  Administered 2012-07-12 – 2012-07-14 (×5): 1 mg via ORAL
  Administered 2012-07-14: 0.5 mg via ORAL
  Filled 2012-07-09 (×4): qty 1
  Filled 2012-07-09 (×2): qty 2
  Filled 2012-07-09 (×2): qty 1
  Filled 2012-07-09 (×2): qty 2
  Filled 2012-07-09: qty 1
  Filled 2012-07-09 (×2): qty 2
  Filled 2012-07-09 (×2): qty 1
  Filled 2012-07-09: qty 2
  Filled 2012-07-09: qty 1
  Filled 2012-07-09: qty 2
  Filled 2012-07-09 (×4): qty 1
  Filled 2012-07-09: qty 2

## 2012-07-09 NOTE — Progress Notes (Signed)
*  PRELIMINARY RESULTS* Vascular Ultrasound Carotid Duplex (Doppler) has been completed.  Preliminary findings: Bilateral:  Less than 39% ICA stenosis.  Vertebral artery flow is antegrade.      Farrel Demark, RDMS, RVT  07/09/2012, 9:11 AM

## 2012-07-09 NOTE — Progress Notes (Signed)
[facility] PROGRESS NOTE  ECHO ALLSBROOK OZH:[id number] DOB: [birth year] DOA: 07/08/2012 PCP: Mickie Hillier, MD  Assessment/Plan: 1-Presumed CVA (Lacunar infarct): work up in process. Will follow MRI/MRA and 2-D echo. -will continue xarelto  2-HTN: stable: will continue home medications  3-Atrial fibrillation: will continue xarelto and also diltiazem  4-BPH: continue flomax and proscar  5-HLD: continue statins  DVT: on xarelto  Code Status: Full Family Communication: daughter at bedside Disposition Plan: to be determine   Consultants:  neurology  Procedures:  See below for x-ray reports  Antibiotics:  none  HPI/Subjective: Afebrile, no CP, no SOB.  Objective: Filed Vitals:   07/09/12 0502 07/09/12 0800 07/09/12 1000 07/09/12 1400  BP: 125/85 128/76 110/60 105/69  Pulse: 85 60 77 91  Temp: 97.8 F (36.6 C) 97.7 F (36.5 C) 97.6 F (36.4 C) 98.1 F (36.7 C)  TempSrc: Oral Oral Oral Oral  Resp: 18 18 18 18   Height:      Weight:      SpO2: 98% 97% 100% 99%    Intake/Output Summary (Last 24 hours) at 07/09/12 1612 Last data filed at 07/09/12 1255  Gross per 24 hour  Intake    600 ml  Output    451 ml  Net    149 ml   Filed Weights   07/09/12 0001  Weight: 60 kg (132 lb 4.4 oz)    Exam:   General:  Feeling better and back to baseline, no fever  Cardiovascular: rate controlled, no rubs or gallops  Respiratory: CTA bilaterally  Abdomen: soft, NT, ND, positive BS  Musculoskeletal: no edema, no cyanosis  Neuro: CN intact, MS 5/5 bilaterally, able to follow commands properly  Data Reviewed: Basic Metabolic Panel:  Recent Labs Lab 07/04/12 0519 07/08/12 1828 07/08/12 1911  NA 141 139 141  K 4.6 4.4 4.3  CL 106 100 99  CO2 29 34*  --   GLUCOSE 101* 77 76  BUN 9 18 17   CREATININE 0.94 1.16 1.20  CALCIUM 8.7 9.8  --    Liver Function Tests:  Recent Labs Lab 07/04/12 0519 07/08/12 1828  AST 14 20  ALT 11 16  ALKPHOS 60  74  BILITOT 0.2* 0.3  PROT 5.6* 6.7  ALBUMIN 3.0* 3.9   CBC:  Recent Labs Lab 07/03/12 0516 07/04/12 0519 07/08/12 1828 07/08/12 1911  WBC 9.1 8.3 12.0*  --   NEUTROABS  --   --  8.4*  --   HGB 13.4 13.6 15.6 15.6  HCT 40.7 41.1 44.3 46.0  MCV 92.5 92.2 92.9  --   PLT 119* 118* 161  --    Cardiac Enzymes:  Recent Labs Lab 07/03/12 1342 07/03/12 1900 07/04/12 0010 07/08/12 1852  TROPONINI <0.30 <0.30 <0.30 <0.30   CBG:  Recent Labs Lab 07/08/12 1841  GLUCAP 88    Recent Results (from the past 240 hour(s))  URINE CULTURE     Status: None   Collection Time    07/02/12  4:08 PM      Result Value Range Status   Specimen Description URINE, CATHETERIZED   Final   Special Requests NONE   Final   Culture  Setup Time 07/03/2012 05:00   Final   Colony Count NO GROWTH   Final   Culture NO GROWTH   Final   Report Status 07/04/2012 FINAL   Final     Studies: Ct Head Wo Contrast  07/08/2012   *RADIOLOGY REPORT*  Clinical Data: Slurred speech and confusion  with unsteady gait. History of hypertension and atrial fibrillation.  CT HEAD WITHOUT CONTRAST  Technique:  Contiguous axial images were obtained from the base of the skull through the vertex without contrast.  Comparison: None.  Findings: No acute intracranial abnormality is identified. Specifically, no hemorrhage, hydrocephalus, mass effect, mass lesion, or evidence of acute cortically based infarction is identified.  4 mm right thalamic hypodensity likely reflects a prior lacunar infarction.  No prior imaging studies for comparison.  Mild proptosis bilaterally.  Skull intact.  Visualized paranasal sinuses and mastoid air cells clear.  IMPRESSION:  1. 4 mm hypodensity in the right thalamus likely reflects a lacunar infarction.  Age of the infarction is uncertain 2. Negative for hemorrhage, hydrocephalus, midline shift, or evidence of acute cortically-based large territory infarction.  Findings discussed with Dr. Roseanne Reno of  neurology 6:43 p.m. 07/08/2012.   Original Report Authenticated By: Britta Mccreedy, M.D.    Scheduled Meds: . ALPRAZolam  0.5-1 mg Oral TID PC & HS  . diltiazem  120 mg Oral Daily  . feeding supplement  237 mL Oral BID BM  . finasteride  5 mg Oral Daily  . nicotine  7 mg Transdermal Daily  . [START ON 07/10/2012] polyethylene glycol  17 g Oral Daily  . Rivaroxaban  20 mg Oral Q supper  . simvastatin  5 mg Oral q1800  . tamsulosin  0.4 mg Oral QPC supper   Continuous Infusions: . sodium chloride 75 mL/hr at 07/08/12 2359    Active Problems:   Acute urinary retention   Atrial fibrillation   HTN (hypertension), benign   CVA (cerebral infarction)   Protein-calorie malnutrition, severe    Time spent: >30 minutes    Shannon Kirby  [facility] Pager [phone]. If 7PM-7AM, please contact night-coverage at [email or web address], password [facility] 07/09/2012, 4:12 PM  LOS: 1 day

## 2012-07-09 NOTE — Evaluation (Signed)
Speech Language Pathology Evaluation Patient Details Name: Shannon Kirby MRN: [id number] DOB: [birth year] Today's Date: 07/09/2012 Time: 1000-1021 SLP Time Calculation (min): 21 min  Problem List:  Patient Active Problem List   Diagnosis Date Noted  . CVA (cerebral infarction) 07/08/2012  . Orthostatic hypotension 07/02/2012  . UTI (lower urinary tract infection) 07/02/2012  . Near syncope 07/02/2012  . Acute urinary retention 06/30/2012  . Atrial fibrillation 06/30/2012  . HTN (hypertension), benign 06/30/2012  . Anxiety 06/30/2012   Past Medical History:  Past Medical History  Diagnosis Date  . Hypertension   . Hernia     L groin   . Hyperlipidemia   . Pre-diabetes   . A-fib    Past Surgical History:  Past Surgical History  Procedure Laterality Date  . No past surgeries     HPI:   week hx of urinary retention he was admitted to [facility] and had a foley place discharged to home. At that time he was also diagnosed with UTI and treated with 3 day course.  He was readmitted for dehydration and was found to be in A.fib and started xarelto. Today patient went to urology for follow up and was strated on proscar and repaflo. He went home and took a nap at 3 pm he woke up at 4:30 with slurred speech, ataxia, confusion, falling over. He was taken to [facility] ED. Code stroke was called but he was not a candidate for tPA due to being on xarelto. CT scan showed 4 mm hypodensity in the right thalamus likely reflects a lacunar infarction. Cognitive Linguistic Evaluation ordered per stroke protocol.  Per patient he lives with his daughter who assists with complex ADL's and his son assists with yard work.    Assessment / Plan / Recommendation Clinical Impression  Cognitive Linguistic Evaluation completed per stroke protocol.  Receptive and expressive language skills judged to be functional with no observed aphasia. Cognitive skills judged to be at baseline.  No further Speech Language Pathology Services  warranted as patient will receive necessary 24 hour supervision from family members.  ST to sign off as education complete.      SLP Assessment  Patient does not need any further Speech Lanaguage Pathology Services    Follow Up Recommendations  None        SLP Evaluation Prior Functioning  Cognitive/Linguistic Baseline: Within functional limits Type of Home: House Lives With: Daughter Available Help at Discharge: Family;Available 24 hours/day Vocation: Retired   IT consultant  Overall Cognitive Status: Within Functional Limits for tasks assessed Arousal/Alertness: Awake/alert Orientation Level: Oriented X4 Memory: Appears intact Awareness: Appears intact Problem Solving: Appears intact Safety/Judgment: Appears intact    Comprehension  Auditory Comprehension Overall Auditory Comprehension: Appears within functional limits for tasks assessed Visual Recognition/Discrimination Discrimination: Not tested Reading Comprehension Reading Status: Not tested    Expression Expression Primary Mode of Expression: Verbal Verbal Expression Overall Verbal Expression: Appears within functional limits for tasks assessed Written Expression Dominant Hand: Right Written Expression: Not tested   Oral / Motor Oral Motor/Sensory Function Overall Oral Motor/Sensory Function: Appears within functional limits for tasks assessed Motor Speech Overall Motor Speech: Appears within functional limits for tasks assessed   GO    Moreen Fowler MS, CCC-SLP [phone] [facility] 07/09/2012, 10:31 AM

## 2012-07-09 NOTE — Progress Notes (Signed)
PT Cancellation Note  Patient Details Name: Shannon Kirby MRN: [id number] DOB: Jan 15, 1932   Cancelled Treatment:    Reason Eval/Treat Not Completed: Patient at procedure or test/unavailable. Will f/u tomorrow.   [facility] HELEN 07/09/2012, 3:58 PM Pager: [phone]

## 2012-07-09 NOTE — Progress Notes (Signed)
Occupational Therapy Treatment Patient Details Name: Shannon Kirby MRN: [id number] DOB: 1932-01-17 Today's Date: 07/09/2012 Time: 0865-7846 OT Time Calculation (min): 18 min  OT Assessment / Plan / Recommendation Comments on Treatment Session  This 79 y.o. Male admitted with slurred speech.  MRI showed small acute infarct Lt. Pontomedullary junction.  Pt appears to be back to baseline from an OT perspective.  No functional deficits noted. Is supervision to modified independent with BADLs.  No futher OT needs, will sign off.     Follow Up Recommendations  No OT follow up    Barriers to Discharge       Equipment Recommendations  None recommended by OT    Recommendations for Other Services    Frequency     Plan      Precautions / Restrictions     Pertinent Vitals/Pain     ADL  Eating/Feeding: Independent Where Assessed - Eating/Feeding: Bed level Grooming: Wash/dry face;Wash/dry hands;Teeth care;Supervision/safety Where Assessed - Grooming: Unsupported standing Upper Body Bathing: Set up Where Assessed - Upper Body Bathing: Unsupported sitting Lower Body Bathing: Set up Where Assessed - Lower Body Bathing: Unsupported sit to stand Upper Body Dressing: Set up Where Assessed - Upper Body Dressing: Unsupported sitting Lower Body Dressing: Set up Where Assessed - Lower Body Dressing: Unsupported sit to stand Toilet Transfer: Supervision/safety Toilet Transfer Method: Sit to stand;Stand pivot Acupuncturist: Comfort height toilet;Regular height toilet Toileting - Clothing Manipulation and Hygiene: Modified independent Where Assessed - Engineer, mining and Hygiene: Standing Tub/Shower Transfer: Supervision/safety Tub/Shower Transfer Method: Ambulating Transfers/Ambulation Related to ADLs: supervision in hallway due to catheter and IV pole.  Pt did fatigue requiring one rest break ADL Comments: Pt able to perform simulated shower in standing with  supervision.  No LOB noted.  He is able to retrieve items from floor without LOB.  He feels he is back to baseline    OT Diagnosis:    OT Problem List:   OT Treatment Interventions:     OT Goals    Visit Information  Last OT Received On: 07/09/12    Subjective Data  Subjective: "I feel I'Kirby back to normal" Patient Stated Goal: To go home.    Prior Functioning  Home Living Lives With: Daughter Available Help at Discharge: Family;Available 24 hours/day Type of Home: House Home Access: Stairs to enter [employer] of Steps: 4 Entrance Stairs-Rails: Left Home Layout: One level Bathroom Shower/Tub: Engineer, manufacturing systems: Standard Bathroom Accessibility: Yes How Accessible: Accessible via walker Home Adaptive Equipment: Walker - rolling Prior Function Level of Independence: Independent Able to Take Stairs?: Yes Driving: Yes Vocation: Retired Musician: No difficulties Dominant Hand: Right    Cognition  Cognition Arousal/Alertness: Awake/alert Behavior During Therapy: WFL for tasks assessed/performed Overall Cognitive Status: Within Functional Limits for tasks assessed    Mobility  Bed Mobility Bed Mobility: Supine to Sit;Sitting - Scoot to [employer] of Bed;Sit to Supine Supine to Sit: 7: Independent Sitting - Scoot to Edge of Bed: 7: Independent Sit to Supine: 7: Independent Transfers Transfers: Sit to Stand;Stand to Sit Sit to Stand: 6: Modified independent (Device/Increase time) Stand to Sit: 6: Modified independent (Device/Increase time)    Exercises      Balance Balance Balance Assessed: Yes Dynamic Standing Balance Dynamic Standing - Balance Support: No upper extremity supported Dynamic Standing - Level of Assistance: 5: Stand by assistance Dynamic Standing - Balance Activities: Other (comment) (simulated shower in standing; retreiving item from floor)   End  of Session OT - End of Session Activity Tolerance: Patient  tolerated treatment well Patient left: in bed;with call bell/phone within reach  GO     Shannon Kirby 07/09/2012, 6:40 PM

## 2012-07-09 NOTE — Progress Notes (Signed)
INITIAL NUTRITION ASSESSMENT  Pt meets criteria for SEVERE MALNUTRITION in the context of chronic illness as evidenced by severe fat and muscle wasting.  DOCUMENTATION CODES Per approved criteria  -Severe malnutrition in the context of chronic illness -Underweight   INTERVENTION: Ensure Complete po BID, each supplement provides 350 kcal and 13 grams of protein.  NUTRITION DIAGNOSIS: Malnutrition related to chronic illness as evidenced by severe fat and muscle wasting.   Goal: Pt to meet >/= 90% of their estimated nutrition needs.  Monitor:  PO intake, supplement acceptance, weight trend, labs  Reason for Assessment: Pt identified as at nutrition risk on the Malnutrition Screen Tool  79 y.o. male  Admitting Dx: Slurred Speech  ASSESSMENT: Pt recently dx with a fib, admitted with new stroke. Work up pending. Per pt he has always been thin. Pt's daughter lives with pt and does the cooking. 24 hour recall reviewed per pt he eats cereal for Breakfast, and Lunch and Dinner are a baked potato with vegetables with milk to drink. Per pt he does not really eat meat, but occasionally has a salmon patty. Pt does like ensure and drinks about 2 per day. Pt feels he is eating well but appears diet is lacking in calories and protein to meet pt's needs.   Nutrition Focused Physical Exam:  Subcutaneous Fat:  Orbital Region: WNL Upper Arm Region: severe wasting Thoracic and Lumbar Region: mild-moderate wasting  Muscle:  Temple Region: severe wasting Clavicle Bone Region: severe wasting Clavicle and Acromion Bone Region: severe wasting Scapular Bone Region: mild-moderate wasting Dorsal Hand: mild-moderate wasting Patellar Region: severe wasting Anterior Thigh Region: WNL Posterior Calf Region: WNL  Edema: not present   Height: Ht Readings from Last 1 Encounters:  07/09/12 6' 3.5" (1.918 m)    Weight: Wt Readings from Last 1 Encounters:  07/09/12 132 lb 4.4 oz (60 kg)    Ideal  Body Weight: 90.4 kg  % Ideal Body Weight: 66%  Wt Readings from Last 10 Encounters:  07/09/12 132 lb 4.4 oz (60 kg)  07/04/12 134 lb 14.7 oz (61.2 kg)  06/30/12 133 lb (60.328 kg)    Usual Body Weight: 135 lb  % Usual Body Weight: 98%  BMI:  Body mass index is 16.31 kg/(m^2). Underweight  Estimated Nutritional Needs: Kcal: 1700-1900 Protein: 80-90 Fluid: >1.7 L/day  Skin: no issues noted  Diet Order: Cardiac Meal Completion: 100%   EDUCATION NEEDS: -No education needs identified at this time   Intake/Output Summary (Last 24 hours) at 07/09/12 0838 Last data filed at 07/09/12 0812  Gross per 24 hour  Intake    360 ml  Output    450 ml  Net    -90 ml    Last BM: 6/18   Labs:   Recent Labs Lab 07/02/12 1605 07/04/12 0519 07/08/12 1828 07/08/12 1911  NA 139 141 139 141  K 4.4 4.6 4.4 4.3  CL 102 106 100 99  CO2 27 29 34*  --   BUN 12 9 18 17   CREATININE 1.10 0.94 1.16 1.20  CALCIUM 9.6 8.7 9.8  --   GLUCOSE 94 101* 77 76    CBG (last 3)   Recent Labs  07/08/12 1841  GLUCAP 88    Scheduled Meds: . ALPRAZolam  0.5-1 mg Oral TID PC & HS  . aspirin  300 mg Rectal Daily   Or  . aspirin  325 mg Oral Daily  . diltiazem  120 mg Oral Daily  . finasteride  5 mg Oral Daily  . nicotine  7 mg Transdermal Daily  . Rivaroxaban  20 mg Oral Q supper  . simvastatin  5 mg Oral q1800  . tamsulosin  0.4 mg Oral QPC supper    Continuous Infusions: . sodium chloride 75 mL/hr at 07/08/12 2359    Past Medical History  Diagnosis Date  . Hypertension   . Hernia     L groin   . Hyperlipidemia   . Pre-diabetes   . A-fib     Past Surgical History  Procedure Laterality Date  . No past surgeries      Kendell Bane RD, LDN, CNSC [phone] Pager [phone] After Hours Pager

## 2012-07-09 NOTE — Progress Notes (Signed)
NEURO HOSPITALIST PROGRESS NOTE   SUBJECTIVE:                                                                                                                         Patient feels slight nausea but no other complaints.  He still feels his speech was not changed yesterday but his wife felt it was slurred.  He feels back to his baseline now.   OBJECTIVE:                                                                                                                           Vital signs in last 24 hours: Temp:  [97.6 F (36.4 C)-98.3 F (36.8 C)] 97.7 F (36.5 C) (06/18 0800) Pulse Rate:  [40-101] 60 (06/18 0800) Resp:  [15-28] 18 (06/18 0800) BP: (107-135)/(50-85) 128/76 mmHg (06/18 0800) SpO2:  [94 %-100 %] 97 % (06/18 0800) Weight:  [60 kg (132 lb 4.4 oz)] 60 kg (132 lb 4.4 oz) (06/18 0001)  Intake/Output from previous day: 06/17 0701 - 06/18 0700 In: -  Out: 450 [Urine:450] Intake/Output this shift: Total I/O In: 360 [P.O.:360] Out: -  Nutritional status: Cardiac  Past Medical History  Diagnosis Date  . Hypertension   . Hernia     L groin   . Hyperlipidemia   . Pre-diabetes   . A-fib      Neurologic Exam:  Mental Status: Alert, oriented, thought content appropriate.  Speech fluent without evidence of aphasia.  Able to follow 3 step commands without difficulty. Cranial Nerves: II:  Visual fields grossly normal, pupils equal, round, reactive to light and accommodation III,IV, VI: ptosis not present, extra-ocular motions intact bilaterally V,VII: smile symmetric but at rest he has a slight left NL fold decrease, facial light touch sensation normal bilaterally VIII: hearing normal bilaterally IX,X: gag reflex present XI: bilateral shoulder shrug XII: midline tongue extension Motor: Right : Upper extremity   5/5    Left:     Upper extremity   5/5  Lower extremity   5/5     Lower extremity   5/5 Tone and bulk:normal tone  throughout; no atrophy noted --orbits right hand over left Sensory: Pinprick and light touch intact throughout, bilaterally Deep  Tendon Reflexes: 2+ and symmetric throughout UE and KJ, 1+ bilateral AJ Plantars: Right: downgoing   Left: equivocal with fanning toes Cerebellar: normal finger-to-nose,  normal heel-to-shin test CV: pulses palpable throughout    Lab Results: Lab Results  Component Value Date/Time   CHOL 133 07/09/2012  5:00 AM   Lipid Panel  Recent Labs  07/09/12 0500  CHOL 133  TRIG 152*  HDL 42  CHOLHDL 3.2  VLDL 30  LDLCALC 61    Studies/Results: Ct Head Wo Contrast  07/08/2012   *RADIOLOGY REPORT*  Clinical Data: Slurred speech and confusion with unsteady gait. History of hypertension and atrial fibrillation.  CT HEAD WITHOUT CONTRAST  Technique:  Contiguous axial images were obtained from the base of the skull through the vertex without contrast.  Comparison: None.  Findings: No acute intracranial abnormality is identified. Specifically, no hemorrhage, hydrocephalus, mass effect, mass lesion, or evidence of acute cortically based infarction is identified.  4 mm right thalamic hypodensity likely reflects a prior lacunar infarction.  No prior imaging studies for comparison.  Mild proptosis bilaterally.  Skull intact.  Visualized paranasal sinuses and mastoid air cells clear.  IMPRESSION:  1. 4 mm hypodensity in the right thalamus likely reflects a lacunar infarction.  Age of the infarction is uncertain 2. Negative for hemorrhage, hydrocephalus, midline shift, or evidence of acute cortically-based large territory infarction.  Findings discussed with Dr. Roseanne Reno of neurology 6:43 p.m. 07/08/2012.   Original Report Authenticated By: Britta Mccreedy, M.D.    MEDICATIONS                                                                                                                        Scheduled: . ALPRAZolam  0.5-1 mg Oral TID PC & HS  . aspirin  300 mg Rectal Daily    Or  . aspirin  325 mg Oral Daily  . diltiazem  120 mg Oral Daily  . finasteride  5 mg Oral Daily  . nicotine  7 mg Transdermal Daily  . Rivaroxaban  20 mg Oral Q supper  . simvastatin  5 mg Oral q1800  . tamsulosin  0.4 mg Oral QPC supper   2 D echo on 07/01/12  Study Conclusions  - Left ventricle: The cavity size was normal. Wall thickness was normal. Systolic function was normal. The estimated ejection fraction was in the range of 50% to 55%. - Mitral valve: Mild regurgitation. - Tricuspid valve: Moderate regurgitation. - Pulmonary arteries: Systolic pressure was moderately increased. PA peak pressure: 47mm Hg (S). - Pericardium, extracardiac: A trivial pericardial effusion was identified.  LDL --61  A1c--5.7  Dopplers --No hemodynamically significant stenosis  MRI/MRA Brain--pending  Echo--performed 6/10 shows no intracardiac masses or thrombi  Felicie Morn PA-C [facility] [phone]  07/09/2012, 9:14 AM   Patient seen and examined.  Clinical course and management discussed.  Necessary edits performed.  I agree with the above.  Assessment and plan of care developed and discussed below.  ASSESSMENT/PLAN:                                                                                                           79 year old male with a history of atrial fibrillation who experienced an episode of altered mental status on Xarelto.  Patient reports he just did not feel right.  Is now back to baseline.   Recommendations: 1. MRI/A of the brain.  Will follow up results.   2. Agree with continued Xarelto   Thana Farr, MD [facility] [phone]  07/09/2012  1:29 PM

## 2012-07-10 DIAGNOSIS — K921 Melena: Secondary | ICD-10-CM

## 2012-07-10 LAB — CBC
Hemoglobin: 14.6 g/dL (ref 13.0–17.0)
MCH: 31.5 pg (ref 26.0–34.0)
RBC: 4.64 MIL/uL (ref 4.22–5.81)

## 2012-07-10 LAB — BASIC METABOLIC PANEL
CO2: 31 mEq/L (ref 19–32)
Calcium: 9.5 mg/dL (ref 8.4–10.5)
Glucose, Bld: 107 mg/dL — ABNORMAL HIGH (ref 70–99)
Potassium: 4.5 mEq/L (ref 3.5–5.1)
Sodium: 140 mEq/L (ref 135–145)

## 2012-07-10 LAB — PROTIME-INR
INR: 1.13 (ref 0.00–1.49)
Prothrombin Time: 14.3 seconds (ref 11.6–15.2)

## 2012-07-10 MED ORDER — PEG 3350-KCL-NA BICARB-NACL 420 G PO SOLR
4000.0000 mL | Freq: Once | ORAL | Status: AC
  Administered 2012-07-10: 4000 mL via ORAL
  Filled 2012-07-10: qty 4000

## 2012-07-10 MED ORDER — METOCLOPRAMIDE HCL 5 MG PO TABS
5.0000 mg | ORAL_TABLET | Freq: Three times a day (TID) | ORAL | Status: DC
  Administered 2012-07-10 – 2012-07-14 (×11): 5 mg via ORAL
  Filled 2012-07-10 (×14): qty 1

## 2012-07-10 MED ORDER — RIVAROXABAN 20 MG PO TABS
20.0000 mg | ORAL_TABLET | Freq: Every day | ORAL | Status: DC
  Filled 2012-07-10: qty 1

## 2012-07-10 MED ORDER — ONDANSETRON HCL 4 MG/2ML IJ SOLN
4.0000 mg | Freq: Once | INTRAMUSCULAR | Status: AC
  Administered 2012-07-10: 4 mg via INTRAVENOUS
  Filled 2012-07-10: qty 2

## 2012-07-10 MED ORDER — SODIUM CHLORIDE 0.9 % IV SOLN: INTRAVENOUS | Status: DC

## 2012-07-10 NOTE — Progress Notes (Signed)
Pt  Seen by Dr  Bosie Clos and new orders given and noted. Diet change to clear liquid and will be NPO post midnight for for colonoscopy tomorrow pt made aware  Pre procedure prep started.Marland Kitchen

## 2012-07-10 NOTE — Progress Notes (Signed)
PT Cancellation Note  Patient Details Name: Shannon Kirby MRN: [id number] DOB: [birth year]   Cancelled Treatment:    Reason Eval/Treat Not Completed: Patient declining therapy this morning because he feels dizzy and nauseous. Will try back if time allows. Otherwise it may be tomorrow morning before we can do our evaluation. Patient aware.   [facility] HELEN 07/10/2012, 10:58 AM Pager: [phone]

## 2012-07-10 NOTE — Progress Notes (Signed)
[facility] PROGRESS NOTE  [name] NWG:[id number] DOB: Jan 04, 1932 DOA: 07/08/2012 PCP: Mickie Hillier, MD  Assessment/Plan: 1-Left pontomedullary junction infarct:  *MRI of the brain 07/09/2012 Motion degraded exam. Tiny acute left pontomedullary junction infarct suspected.  *MRA of the brain 07/09/2012 Significant intracranial atherosclerotic type changes more notable involving the posterior circulation  *2D Echocardiogram 07/01/2012 EF 50-55% with no source of embolus.  *Carotid Doppler 07/09/2012 No evidence of hemodynamically significant internal carotid artery stenosis. Vertebral artery flow is antegrade.  -will continue xarelto -continue risk factors modifications  2-HTN: stable: will continue home medications  3-Atrial fibrillation: will continue xarelto and diltiazem  4-BPH: continue flomax and proscar  5-HLD: continue statins  6-Bloody stools: patient with family hx of colon cancer and never had colonoscopy. Most likely secondary to hemorrhoids given constipation and then bloody stools; but other etiologies includes diverticulosis, AVM's and malignancy. -will ask GI to see patient as he needs to be on xarelto -will add miralax for constipation -will follow CBC  DVT: on xarelto  Code Status: Full Family Communication: daughter at bedside Disposition Plan: to be determine; patient expecting to go home with family at discharge.   Consultants:  neurology  Procedures:  See below for x-ray reports  Antibiotics:  none  HPI/Subjective: Afebrile, no CP, no SOB. Complaining of nausea; also with bloody stools X 3.  Objective: Filed Vitals:   07/10/12 0139 07/10/12 0500 07/10/12 1000 07/10/12 1300  BP: 104/71 103/74 107/71 135/70  Pulse: 40 86 84 94  Temp: 97.8 F (36.6 C) 97.2 F (36.2 C) 97.6 F (36.4 C) 98.3 F (36.8 C)  TempSrc: Axillary Axillary Oral Oral  Resp:  18 18 18   Height:      Weight:      SpO2: 94% 96% 95% 96%    Intake/Output  Summary (Last 24 hours) at 07/10/12 1627 Last data filed at 07/10/12 1257  Gross per 24 hour  Intake    840 ml  Output    200 ml  Net    640 ml   Filed Weights   07/09/12 0001  Weight: 60 kg (132 lb 4.4 oz)    Exam:   General:  Feeling better and back to baseline, no fever; complaining of nausea  Cardiovascular: rate controlled, no rubs or gallops  Respiratory: CTA bilaterally  Abdomen: soft, NT, ND, positive BS  Musculoskeletal: no edema, no cyanosis  Neuro: CN intact, MS 5/5 bilaterally, able to follow commands properly  Data Reviewed: Basic Metabolic Panel:  Recent Labs Lab 07/04/12 0519 07/08/12 1828 07/08/12 1911 07/10/12 0600  NA 141 139 141 140  K 4.6 4.4 4.3 4.5  CL 106 100 99 103  CO2 29 34*  --  31  GLUCOSE 101* 77 76 107*  BUN 9 18 17 13   CREATININE 0.94 1.16 1.20 1.04  CALCIUM 8.7 9.8  --  9.5   Liver Function Tests:  Recent Labs Lab 07/04/12 0519 07/08/12 1828  AST 14 20  ALT 11 16  ALKPHOS 60 74  BILITOT 0.2* 0.3  PROT 5.6* 6.7  ALBUMIN 3.0* 3.9   CBC:  Recent Labs Lab 07/04/12 0519 07/08/12 1828 07/08/12 1911 07/10/12 0600  WBC 8.3 12.0*  --  9.0  NEUTROABS  --  8.4*  --   --   HGB 13.6 15.6 15.6 14.6  HCT 41.1 44.3 46.0 43.9  MCV 92.2 92.9  --  94.6  PLT 118* 161  --  155   Cardiac Enzymes:  Recent Labs Lab  07/03/12 1900 07/04/12 0010 07/08/12 1852  TROPONINI <0.30 <0.30 <0.30   CBG:  Recent Labs Lab 07/08/12 1841  GLUCAP 88    Recent Results (from the past 240 hour(s))  URINE CULTURE     Status: None   Collection Time    07/02/12  4:08 PM      Result Value Range Status   Specimen Description URINE, CATHETERIZED   Final   Special Requests NONE   Final   Culture  Setup Time 07/03/2012 05:00   Final   Colony Count NO GROWTH   Final   Culture NO GROWTH   Final   Report Status 07/04/2012 FINAL   Final     Studies: Ct Head Wo Contrast  07/08/2012   *RADIOLOGY REPORT*  Clinical Data: Slurred speech and  confusion with unsteady gait. History of hypertension and atrial fibrillation.  CT HEAD WITHOUT CONTRAST  Technique:  Contiguous axial images were obtained from the base of the skull through the vertex without contrast.  Comparison: None.  Findings: No acute intracranial abnormality is identified. Specifically, no hemorrhage, hydrocephalus, mass effect, mass lesion, or evidence of acute cortically based infarction is identified.  4 mm right thalamic hypodensity likely reflects a prior lacunar infarction.  No prior imaging studies for comparison.  Mild proptosis bilaterally.  Skull intact.  Visualized paranasal sinuses and mastoid air cells clear.  IMPRESSION:  1. 4 mm hypodensity in the right thalamus likely reflects a lacunar infarction.  Age of the infarction is uncertain 2. Negative for hemorrhage, hydrocephalus, midline shift, or evidence of acute cortically-based large territory infarction.  Findings discussed with Dr. Roseanne Reno of neurology 6:43 p.m. 07/08/2012.   Original Report Authenticated By: Britta Mccreedy, M.D.   Mr Brain Wo Contrast  07/09/2012   *RADIOLOGY REPORT*  Clinical Data:  Hyperlipidemic hypertensive patient presenting with slurred speech, ataxia and confusion.  MRI BRAIN WITHOUT CONTRAST MRA HEAD WITHOUT CONTRAST  Technique: Multiplanar, multiecho pulse sequences of the brain and surrounding structures were obtained according to standard protocol without intravenous contrast.  Angiographic images of the head were obtained using MRA technique without contrast.  Comparison: 07/08/2012 head CT.  MRI HEAD  Findings:  Motion degraded exam.  Tiny acute left pontomedullary junction infarct suspected.  No intracranial hemorrhage.  Remote right thalamic infarct.  Mild small vessel disease type changes.  Global atrophy without hydrocephalus.  Paranasal sinus mucosal thickening.  No intracranial mass lesion detected on this unenhanced exam.  Mild spinal stenosis C2-3  Cervical medullary junction,  pituitary region, pineal region and orbital structures unremarkable.  IMPRESSION: Motion degraded exam.  Tiny acute left pontomedullary junction infarct suspected.  MRA HEAD  Findings: Motion degraded exam.  Markedly irregular narrowed vertebral arteries.  Basilar artery not visualized.  Nonvisualization AICAs and majority of PICAs.  High-grade tandem stenoses superior cerebellar arteries and posterior cerebral arteries bilaterally.  Mild irregularity and narrowing cavernous segment internal carotid artery bilaterally.  Moderate tandem stenoses A1 segment right anterior cerebral artery.  Marked narrowing of the left anterior cerebral artery A2 segment.  Mild to moderate narrowing M1 segment right middle cerebral artery with mild narrowing M1 segment left middle cerebral artery. Moderate to marked narrowing involving portions of the middle cerebral artery branch vessels bilaterally.  No obvious aneurysm.  IMPRESSION: Significant intracranial atherosclerotic type changes more notable involving the posterior circulation as discussed above the  This has been made a PRA call report utilizing dashboard call feature.   Original Report Authenticated By: Lacy Duverney, M.D.  Mr Maxine Glenn Head/brain Wo Cm  07/09/2012   *RADIOLOGY REPORT*  Clinical Data:  Hyperlipidemic hypertensive patient presenting with slurred speech, ataxia and confusion.  MRI BRAIN WITHOUT CONTRAST MRA HEAD WITHOUT CONTRAST  Technique: Multiplanar, multiecho pulse sequences of the brain and surrounding structures were obtained according to standard protocol without intravenous contrast.  Angiographic images of the head were obtained using MRA technique without contrast.  Comparison: 07/08/2012 head CT.  MRI HEAD  Findings:  Motion degraded exam.  Tiny acute left pontomedullary junction infarct suspected.  No intracranial hemorrhage.  Remote right thalamic infarct.  Mild small vessel disease type changes.  Global atrophy without hydrocephalus.  Paranasal  sinus mucosal thickening.  No intracranial mass lesion detected on this unenhanced exam.  Mild spinal stenosis C2-3  Cervical medullary junction, pituitary region, pineal region and orbital structures unremarkable.  IMPRESSION: Motion degraded exam.  Tiny acute left pontomedullary junction infarct suspected.  MRA HEAD  Findings: Motion degraded exam.  Markedly irregular narrowed vertebral arteries.  Basilar artery not visualized.  Nonvisualization AICAs and majority of PICAs.  High-grade tandem stenoses superior cerebellar arteries and posterior cerebral arteries bilaterally.  Mild irregularity and narrowing cavernous segment internal carotid artery bilaterally.  Moderate tandem stenoses A1 segment right anterior cerebral artery.  Marked narrowing of the left anterior cerebral artery A2 segment.  Mild to moderate narrowing M1 segment right middle cerebral artery with mild narrowing M1 segment left middle cerebral artery. Moderate to marked narrowing involving portions of the middle cerebral artery branch vessels bilaterally.  No obvious aneurysm.  IMPRESSION: Significant intracranial atherosclerotic type changes more notable involving the posterior circulation as discussed above the  This has been made a PRA call report utilizing dashboard call feature.   Original Report Authenticated By: Lacy Duverney, M.D.    Scheduled Meds: . ALPRAZolam  0.5-1 mg Oral TID PC & HS  . diltiazem  120 mg Oral Daily  . feeding supplement  237 mL Oral BID BM  . finasteride  5 mg Oral Daily  . metoCLOPramide  5 mg Oral TID AC  . nicotine  7 mg Transdermal Daily  . polyethylene glycol  17 g Oral Daily  . Rivaroxaban  20 mg Oral Q supper  . simvastatin  5 mg Oral q1800  . tamsulosin  0.4 mg Oral QPC supper   Continuous Infusions: . sodium chloride 50 mL/hr at 07/10/12 0023    Active Problems:   Acute urinary retention   Atrial fibrillation   HTN (hypertension), benign   CVA (cerebral infarction)   Protein-calorie  malnutrition, severe    Time spent: >30 minutes    Shannon Kirby  [facility] Pager [phone]. If 7PM-7AM, please contact night-coverage at [email or web address], password [facility] 07/10/2012, 4:27 PM  LOS: 2 days

## 2012-07-10 NOTE — Progress Notes (Signed)
Pt has bloody red stools. Pt said that doctor had his prostate checked last Wednesday and his stools has been bloody since. MD on-call aware with no new orders. Will continue to monitor pt.

## 2012-07-10 NOTE — Progress Notes (Addendum)
Pt c/o nausea and  Was given   Zofran  4 mg table at 0822 with some  Effect . After lunch pt  c/o of nausea.and intermittent bowel movement  of small soft and pasty brown  stools  mixed with blood x 3 since am.  MD made aware . Order given for the nausea . RN will continue to monitor pt.

## 2012-07-10 NOTE — Progress Notes (Signed)
PT Cancellation Note  Patient Details Name: Shannon Kirby MRN: [id number] DOB: 1931/02/18   Cancelled Treatment:    Reason Eval/Treat Not Completed: Medical issues which prohibited therapy. Pt feeling nauseous. Reports RN going to get him meds to help with this. Will check back when patient ready to mobilize.   [facility] HELEN 07/10/2012, 8:11 AM Pager: [phone]

## 2012-07-10 NOTE — Progress Notes (Signed)
UR COMPLETED  

## 2012-07-10 NOTE — Progress Notes (Signed)
Stroke Team Progress Note  HISTORY Shannon Kirby is an 79 y.o. male who was normal when he laid down for a nap at 1515 on 07/08/2012. When he woke up at 1630 on 07/08/2012, he was noted to have slurred speech and appeared confused. Family called EMS.  Patient has been in hospital recently. Has had UTI, orthostatic hypotension, atrial fibrillation and was put on xarelto around 06/30/2012. Has indwelling catheter. Patient was not a TPA candidate secondary to symptoms quickly resolving. He was admitted for further evaluation and treatment.  SUBJECTIVE No family is at the bedside.  Overall he complains of nausea. No vomiting. Denies numbness, tingling, dizziness. His daughter lives with him.  OBJECTIVE Most recent Vital Signs: Filed Vitals:   07/09/12 1758 07/09/12 2058 07/10/12 0139 07/10/12 0500  BP: 125/65 120/81 104/71 103/74  Pulse: 86 83 40 86  Temp: 97.6 F (36.4 C) 97.9 F (36.6 C) 97.8 F (36.6 C) 97.2 F (36.2 C)  TempSrc: Oral Oral Axillary Axillary  Resp: 18 18  18   Height:      Weight:      SpO2: 96% 99% 94% 96%   CBG (last 3)   Recent Labs  07/08/12 1841  GLUCAP 88    IV Fluid Intake:   . sodium chloride 50 mL/hr at 07/10/12 0023    MEDICATIONS  . ALPRAZolam  0.5-1 mg Oral TID PC & HS  . diltiazem  120 mg Oral Daily  . feeding supplement  237 mL Oral BID BM  . finasteride  5 mg Oral Daily  . nicotine  7 mg Transdermal Daily  . polyethylene glycol  17 g Oral Daily  . Rivaroxaban  20 mg Oral Q supper  . simvastatin  5 mg Oral q1800  . tamsulosin  0.4 mg Oral QPC supper   PRN:  acetaminophen, acetaminophen, ondansetron, senna-docusate  Diet:  Cardiac thin liquids Activity:  OOB with assistance DVT Prophylaxis:  rivoroxaban  CLINICALLY SIGNIFICANT STUDIES Basic Metabolic Panel:   Recent Labs Lab 07/08/12 1828 07/08/12 1911 07/10/12 0600  NA 139 141 140  K 4.4 4.3 4.5  CL 100 99 103  CO2 34*  --  31  GLUCOSE 77 76 107*  BUN 18 17 13   CREATININE  1.16 1.20 1.04  CALCIUM 9.8  --  9.5   Liver Function Tests:   Recent Labs Lab 07/04/12 0519 07/08/12 1828  AST 14 20  ALT 11 16  ALKPHOS 60 74  BILITOT 0.2* 0.3  PROT 5.6* 6.7  ALBUMIN 3.0* 3.9   CBC:   Recent Labs Lab 07/08/12 1828 07/08/12 1911 07/10/12 0600  WBC 12.0*  --  9.0  NEUTROABS 8.4*  --   --   HGB 15.6 15.6 14.6  HCT 44.3 46.0 43.9  MCV 92.9  --  94.6  PLT 161  --  155   Coagulation:   Recent Labs Lab 07/08/12 1828  LABPROT 29.1*  INR 2.94*   Cardiac Enzymes:   Recent Labs Lab 07/03/12 1900 07/04/12 0010 07/08/12 1852  TROPONINI <0.30 <0.30 <0.30   Urinalysis:   Recent Labs Lab 07/08/12 1859  COLORURINE YELLOW  LABSPEC 1.010  PHURINE 7.0  GLUCOSEU NEGATIVE  HGBUR SMALL*  BILIRUBINUR NEGATIVE  KETONESUR NEGATIVE  PROTEINUR 30*  UROBILINOGEN 0.2  NITRITE NEGATIVE  LEUKOCYTESUR NEGATIVE   Lipid Panel    Component Value Date/Time   CHOL 133 07/09/2012 0500   TRIG 152* 07/09/2012 0500   HDL 42 07/09/2012 0500   CHOLHDL 3.2 07/09/2012  0500   VLDL 30 07/09/2012 0500   LDLCALC 61 07/09/2012 0500   HgbA1C  Lab Results  Component Value Date   HGBA1C 5.7* 07/09/2012    Urine Drug Screen:     Component Value Date/Time   LABOPIA NONE DETECTED 07/08/2012 1859   COCAINSCRNUR NONE DETECTED 07/08/2012 1859   LABBENZ POSITIVE* 07/08/2012 1859   AMPHETMU NONE DETECTED 07/08/2012 1859   THCU NONE DETECTED 07/08/2012 1859   LABBARB NONE DETECTED 07/08/2012 1859    Alcohol Level:   Recent Labs Lab 07/08/12 1828  ETH <11   CT of the brain  07/08/2012     1. 4 mm hypodensity in the right thalamus likely reflects a lacunar infarction.  Age of the infarction is uncertain 2. Negative for hemorrhage, hydrocephalus, midline shift, or evidence of acute cortically-based large territory infarction.    MRI of the brain  07/09/2012    Motion degraded exam.  Tiny acute left pontomedullary junction infarct suspected.  MRA of the brain  07/09/2012    Significant intracranial atherosclerotic type changes more notable involving the posterior circulation   2D Echocardiogram  07/01/2012 EF 50-55% with no source of embolus.   Carotid Doppler  07/09/2012 No evidence of hemodynamically significant internal carotid artery stenosis. Vertebral artery flow is antegrade.   CXR    EKG  atrial fibrillation.   Therapy Recommendations no OT, PT pending  Physical Exam  General: The patient is alert and cooperative at the time of the examination.  Skin: No significant peripheral edema is noted.   Neurologic Exam  Cranial nerves: Facial symmetry is present. Speech is normal, no aphasia or dysarthria is noted. Extraocular movements are full. Visual fields are full.  Motor: The patient has good strength in all 4 extremities.  Coordination: The patient has good finger-nose-finger and heel-to-shin bilaterally.  Gait and station: The gait was not tested. No drift is seen.  Reflexes: Deep tendon reflexes are symmetric.      ASSESSMENT Shannon Kirby is a 79 y.o. male presenting with slurred speech and confusion. Imaging confirms a tiny left pontomedullary junction infarct. Infarct felt to be thrombotic secondary to small vessel disease.  On rivaroxaban prior to admission. Now on rivaroxaban for secondary stroke prevention. Patient with resultant nausea, slurred speech resolved. Work up completed.   atrial fibrillation Hypertension Hyperlipidemia, LDL 61, on zocor 5 mg daily PTA, now on zocor 5 mg daily, goal LDL < 100 (< 70 for diabetics) Prediabetes, HgbA1c 5.7  Hospital day # 2  TREATMENT/PLAN  Continue rivaroxaban for secondary stroke prevention. No further stroke workup indicated. Patient has a 10-15% risk of having another stroke over the next year, the highest risk is within 2 weeks of the most recent stroke/TIA (risk of having a stroke following a stroke or TIA is the same). Therapy followup per PT recommendations when they see  today Ongoing risk factor control by Primary Care Physician Stroke Service will sign off. Please call should any needs arise. Follow up with Dr. Anne Hahn, Stroke Clinic, in 2 months.  Annie Main, MSN, RN, ANVP-BC, ANP-BC, [facility] Pager: [phone] 07/10/2012 9:58 AM  I have personally obtained a history, examined the patient, evaluated imaging results, and formulated the assessment and plan of care. I agree with the above. Lesly Dukes

## 2012-07-10 NOTE — Consult Note (Signed)
Referring Provider: Dr. Gwenlyn Perking Primary Care Physician:  Mickie Hillier, MD Primary Gastroenterologist:  Gentry Fitz  Reason for Consultation:  Rectal bleeding  HPI: Shannon Kirby is a 79 y.o. male who was recently diagnosed with a stroke and was placed on Xarelto last week due to CVA and Afib. He was readmitted shortly after discharge due to confusion. He reports history of hemorrhoids in his 43's. Normally moves his bowels daily and has had recent constipation. Today he has been having pasty reddish-brown stool X 6. Denies abdominal pain or rectal pain. +N without vomiting. He has never had a colonoscopy.  Past Medical History  Diagnosis Date  . Hypertension   . Hernia     L groin   . Hyperlipidemia   . Pre-diabetes   . A-fib     Past Surgical History  Procedure Laterality Date  . No past surgeries      Prior to Admission medications   Medication Sig Start Date End Date Taking? Authorizing Provider  ALPRAZolam Prudy Feeler) 1 MG tablet Take 0.5-1 mg by mouth 4 (four) times daily - after meals and at bedtime. 0.5mg  tid and 1mg  qhs   Yes Historical Provider, MD  diltiazem (CARDIZEM CD) 120 MG 24 hr capsule Take 1 capsule (120 mg total) by mouth daily. 07/01/12  Yes Osvaldo Shipper, MD  finasteride (PROSCAR) 5 MG tablet Take 5 mg by mouth daily. 07/08/12  Yes Historical Provider, MD  pravastatin (PRAVACHOL) 10 MG tablet Take 10 mg by mouth every evening.   Yes Historical Provider, MD  Rivaroxaban (XARELTO) 20 MG TABS Take 1 tablet (20 mg total) by mouth daily with supper. 07/01/12  Yes Osvaldo Shipper, MD  silodosin (RAPAFLO) 8 MG CAPS capsule Take 8 mg by mouth daily with breakfast.   Yes Historical Provider, MD    Scheduled Meds: . ALPRAZolam  0.5-1 mg Oral TID PC & HS  . diltiazem  120 mg Oral Daily  . feeding supplement  237 mL Oral BID BM  . finasteride  5 mg Oral Daily  . metoCLOPramide  5 mg Oral TID AC  . nicotine  7 mg Transdermal Daily  . polyethylene glycol  17 g Oral Daily   . polyethylene glycol-electrolytes  4,000 mL Oral Once  . Rivaroxaban  20 mg Oral Q supper  . simvastatin  5 mg Oral q1800  . tamsulosin  0.4 mg Oral QPC supper   Continuous Infusions: . sodium chloride 50 mL/hr at 07/10/12 0023  . sodium chloride     PRN Meds:.acetaminophen, acetaminophen, ondansetron, senna-docusate  Allergies as of 07/08/2012  . (No Known Allergies)    Family History  Problem Relation Age of Onset  . Other Mother     Passed at 59 of "blood clot in chest"  . Colon cancer Father     Passed at 35  . Heart attack Sister     Passed away 72    History   Social History  . Marital Status: Single    Spouse Name: N/A    Number of Children: N/A  . Years of Education: N/A   Occupational History  . Not on file.   Social History Main Topics  . Smoking status: Current Every Day Smoker -- 1.00 packs/day for 63 years  . Smokeless tobacco: Never Used     Comment: Smoked since age 46  . Alcohol Use: No  . Drug Use: No  . Sexually Active: Not on file   Other Topics Concern  . Not on file  Social History Narrative  . No narrative on file    Review of Systems: All negative from GI standpoint except as stated above in HPI.  Physical Exam: Vital signs: Filed Vitals:   07/10/12 1300  BP: 135/70  Pulse: 94  Temp: 98.3 F (36.8 C)  Resp: 18   Last BM Date: 07/10/12 General:   Elderly, frail, thin, pleasant and cooperative in NAD HEENT: anicteric Neck: supple, nontender Lungs:  Clear throughout to auscultation.   No wheezes, crackles, or rhonchi. No acute distress. Heart:  Regular rate and rhythm; no murmurs, clicks, rubs,  or gallops. Abdomen: soft, NT, ND, +BS  Rectal:  Large amount of reddish-brown stool at anus and formed stool in rectal vault; reddish-brown liquid and solid stool on gloved finger; no thrombosed internal hemorrhoids; DRE mildly tender; no thrombosed external hemorrhoids Ext: no edema  GI:  Lab Results:  Recent Labs   07/08/12 1828 07/08/12 1911 07/10/12 0600  WBC 12.0*  --  9.0  HGB 15.6 15.6 14.6  HCT 44.3 46.0 43.9  PLT 161  --  155   BMET  Recent Labs  07/08/12 1828 07/08/12 1911 07/10/12 0600  NA 139 141 140  K 4.4 4.3 4.5  CL 100 99 103  CO2 34*  --  31  GLUCOSE 77 76 107*  BUN 18 17 13   CREATININE 1.16 1.20 1.04  CALCIUM 9.8  --  9.5   LFT  Recent Labs  07/08/12 1828  PROT 6.7  ALBUMIN 3.9  AST 20  ALT 16  ALKPHOS 74  BILITOT 0.3   PT/INR  Recent Labs  07/08/12 1828  LABPROT 29.1*  INR 2.94*     Studies/Results: Ct Head Wo Contrast  07/08/2012   *RADIOLOGY REPORT*  Clinical Data: Slurred speech and confusion with unsteady gait. History of hypertension and atrial fibrillation.  CT HEAD WITHOUT CONTRAST  Technique:  Contiguous axial images were obtained from the base of the skull through the vertex without contrast.  Comparison: None.  Findings: No acute intracranial abnormality is identified. Specifically, no hemorrhage, hydrocephalus, mass effect, mass lesion, or evidence of acute cortically based infarction is identified.  4 mm right thalamic hypodensity likely reflects a prior lacunar infarction.  No prior imaging studies for comparison.  Mild proptosis bilaterally.  Skull intact.  Visualized paranasal sinuses and mastoid air cells clear.  IMPRESSION:  1. 4 mm hypodensity in the right thalamus likely reflects a lacunar infarction.  Age of the infarction is uncertain 2. Negative for hemorrhage, hydrocephalus, midline shift, or evidence of acute cortically-based large territory infarction.  Findings discussed with Dr. Roseanne Reno of neurology 6:43 p.m. 07/08/2012.   Original Report Authenticated By: Britta Mccreedy, M.D.   Mr Brain Wo Contrast  07/09/2012   *RADIOLOGY REPORT*  Clinical Data:  Hyperlipidemic hypertensive patient presenting with slurred speech, ataxia and confusion.  MRI BRAIN WITHOUT CONTRAST MRA HEAD WITHOUT CONTRAST  Technique: Multiplanar, multiecho pulse  sequences of the brain and surrounding structures were obtained according to standard protocol without intravenous contrast.  Angiographic images of the head were obtained using MRA technique without contrast.  Comparison: 07/08/2012 head CT.  MRI HEAD  Findings:  Motion degraded exam.  Tiny acute left pontomedullary junction infarct suspected.  No intracranial hemorrhage.  Remote right thalamic infarct.  Mild small vessel disease type changes.  Global atrophy without hydrocephalus.  Paranasal sinus mucosal thickening.  No intracranial mass lesion detected on this unenhanced exam.  Mild spinal stenosis C2-3  Cervical medullary junction, pituitary region,  pineal region and orbital structures unremarkable.  IMPRESSION: Motion degraded exam.  Tiny acute left pontomedullary junction infarct suspected.  MRA HEAD  Findings: Motion degraded exam.  Markedly irregular narrowed vertebral arteries.  Basilar artery not visualized.  Nonvisualization AICAs and majority of PICAs.  High-grade tandem stenoses superior cerebellar arteries and posterior cerebral arteries bilaterally.  Mild irregularity and narrowing cavernous segment internal carotid artery bilaterally.  Moderate tandem stenoses A1 segment right anterior cerebral artery.  Marked narrowing of the left anterior cerebral artery A2 segment.  Mild to moderate narrowing M1 segment right middle cerebral artery with mild narrowing M1 segment left middle cerebral artery. Moderate to marked narrowing involving portions of the middle cerebral artery branch vessels bilaterally.  No obvious aneurysm.  IMPRESSION: Significant intracranial atherosclerotic type changes more notable involving the posterior circulation as discussed above the  This has been made a PRA call report utilizing dashboard call feature.   Original Report Authenticated By: Lacy Duverney, M.D.   Mr Mra Head/brain Wo Cm  07/09/2012   *RADIOLOGY REPORT*  Clinical Data:  Hyperlipidemic hypertensive patient  presenting with slurred speech, ataxia and confusion.  MRI BRAIN WITHOUT CONTRAST MRA HEAD WITHOUT CONTRAST  Technique: Multiplanar, multiecho pulse sequences of the brain and surrounding structures were obtained according to standard protocol without intravenous contrast.  Angiographic images of the head were obtained using MRA technique without contrast.  Comparison: 07/08/2012 head CT.  MRI HEAD  Findings:  Motion degraded exam.  Tiny acute left pontomedullary junction infarct suspected.  No intracranial hemorrhage.  Remote right thalamic infarct.  Mild small vessel disease type changes.  Global atrophy without hydrocephalus.  Paranasal sinus mucosal thickening.  No intracranial mass lesion detected on this unenhanced exam.  Mild spinal stenosis C2-3  Cervical medullary junction, pituitary region, pineal region and orbital structures unremarkable.  IMPRESSION: Motion degraded exam.  Tiny acute left pontomedullary junction infarct suspected.  MRA HEAD  Findings: Motion degraded exam.  Markedly irregular narrowed vertebral arteries.  Basilar artery not visualized.  Nonvisualization AICAs and majority of PICAs.  High-grade tandem stenoses superior cerebellar arteries and posterior cerebral arteries bilaterally.  Mild irregularity and narrowing cavernous segment internal carotid artery bilaterally.  Moderate tandem stenoses A1 segment right anterior cerebral artery.  Marked narrowing of the left anterior cerebral artery A2 segment.  Mild to moderate narrowing M1 segment right middle cerebral artery with mild narrowing M1 segment left middle cerebral artery. Moderate to marked narrowing involving portions of the middle cerebral artery branch vessels bilaterally.  No obvious aneurysm.  IMPRESSION: Significant intracranial atherosclerotic type changes more notable involving the posterior circulation as discussed above the  This has been made a PRA call report utilizing dashboard call feature.   Original Report  Authenticated By: Lacy Duverney, M.D.    Impression/Plan: 79yo with GI bleed likely lower. He has a large amount of stool in his rectum felt on DRE and the bleeding could be due to a stercoral ulcer or hemorrhoids but he definitely needs a colonoscopy to further evaluate and look for malignancy. Doubt AVMs. Ischemia possible. Will hold Xarelto today and Dr. Gwenlyn Perking ok with that being done. Prep for colonoscopy to be done tomorrow with possible EGD. Clears. NPO p MN. Prep this evening. Patient agreeable to plan and willing to drink colon prep.    LOS: 2 days   Deandre Brannan C.  07/10/2012, 4:52 PM

## 2012-07-11 ENCOUNTER — Encounter (HOSPITAL_COMMUNITY)
Admission: EM | Disposition: A | Discharge status: Home Health | Payer: Self-pay | Source: Home / Self Care | Attending: Internal Medicine

## 2012-07-11 ENCOUNTER — Encounter (HOSPITAL_COMMUNITY): Payer: Self-pay | Admitting: *Deleted

## 2012-07-11 LAB — CBC
MCHC: 34 g/dL (ref 30.0–36.0)
Platelets: 152 10*3/uL (ref 150–400)
RDW: 12.6 % (ref 11.5–15.5)
WBC: 9.1 10*3/uL (ref 4.0–10.5)

## 2012-07-11 LAB — BASIC METABOLIC PANEL
CO2: 32 mEq/L (ref 19–32)
Calcium: 9.4 mg/dL (ref 8.4–10.5)
Creatinine, Ser: 1.08 mg/dL (ref 0.50–1.35)
GFR calc non Af Amer: 63 mL/min — ABNORMAL LOW (ref >90)
Sodium: 140 mEq/L (ref 135–145)

## 2012-07-11 LAB — MAGNESIUM: Magnesium: 1.9 mg/dL (ref 1.5–2.5)

## 2012-07-11 SURGERY — CANCELLED PROCEDURE

## 2012-07-11 MED ORDER — PEG 3350-KCL-NA BICARB-NACL 420 G PO SOLR
4000.0000 mL | Freq: Once | ORAL | Status: AC
  Administered 2012-07-12: 4000 mL via ORAL
  Filled 2012-07-11: qty 4000

## 2012-07-11 MED ORDER — MAGNESIUM SULFATE 40 MG/ML IJ SOLN
2.0000 g | Freq: Once | INTRAMUSCULAR | Status: AC
  Administered 2012-07-11: 2 g via INTRAVENOUS
  Filled 2012-07-11 (×2): qty 50

## 2012-07-11 MED ORDER — SODIUM CHLORIDE 0.9 % IV SOLN: INTRAVENOUS | Status: DC

## 2012-07-11 MED ORDER — DILTIAZEM HCL 25 MG/5ML IV SOLN
20.0000 mg | Freq: Once | INTRAVENOUS | Status: AC
  Administered 2012-07-11: 20 mg via INTRAVENOUS
  Filled 2012-07-11: qty 5

## 2012-07-11 MED ORDER — METOPROLOL TARTRATE 12.5 MG HALF TABLET
12.5000 mg | ORAL_TABLET | Freq: Two times a day (BID) | ORAL | Status: DC
  Administered 2012-07-11 – 2012-07-14 (×4): 12.5 mg via ORAL
  Filled 2012-07-11 (×8): qty 1

## 2012-07-11 NOTE — Progress Notes (Signed)
PT Cancellation Note  Patient Details Name: Shannon Kirby MRN: [id number] DOB: 05-Oct-1931   Cancelled Treatment:    Reason Eval/Treat Not Completed: Fatigue/lethargy limiting ability to participate. Patient declining therapy assessment again this morning because he is "too weak." Noted plans for colonoscopy at 10. Unsure if he will be agreeable to participate in eval after procedure. I would anticipate given his weakness that he will benefit from HHPT on d/c to assist with strengthening. Will attempt back as time allows and as pt is agreeable. Unfortunately if he declines another time it is our policy that we sign off.    [facility] HELEN 07/11/2012, 8:18 AM Pager: [phone]

## 2012-07-11 NOTE — Progress Notes (Signed)
[facility] PROGRESS NOTE  Shannon Kirby ZOX:[id number] DOB: 1931/10/07 DOA: 07/08/2012 PCP: Mickie Hillier, MD  Assessment/Plan: 1-Left pontomedullary junction infarct:  *MRI of the brain 07/09/2012 Motion degraded exam. Tiny acute left pontomedullary junction infarct suspected.  *MRA of the brain 07/09/2012 Significant intracranial atherosclerotic type changes more notable involving the posterior circulation  *2D Echocardiogram 07/01/2012 EF 50-55% with no source of embolus.  *Carotid Doppler 07/09/2012 No evidence of hemodynamically significant internal carotid artery stenosis. Vertebral artery flow is antegrade.  -will continue xarelto after clear from GI given bloody stools -continue risk factors modifications.  2-HTN: stable: will continue home medications  3-Atrial fibrillation: HR uncontrolled this morning. -will continue cardizem; but will give extra IV dose for better control around procedure -will give IV Mg as well -will check EKG, stat BMET and Mg  -if needed will add b-blocker to his regimen. BP stable.  4-BPH: continue flomax and proscar  5-HLD: continue statins  6-Bloody stools: patient with family hx of colon cancer and never had colonoscopy. Most likely secondary to hemorrhoids given constipation and then bloody stools; but other etiologies includes diverticulosis, AVM's and malignancy. -colonoscopy plan for today. -Hgb remains stable in the 14 range  DVT: on xarelto (holding it for colonoscopy)  Code Status: Full Family Communication: daughter at bedside Disposition Plan: to be determine; patient expecting to go home with family at discharge.   Consultants:  neurology  Procedures:  See below for x-ray reports  Antibiotics:  none  HPI/Subjective: Afebrile, no CP, no SOB. Patient seen at endoscopy unit, HR up to 130 no sustained.  Objective: Filed Vitals:   07/11/12 0532 07/11/12 0900 07/11/12 0936 07/11/12 0938  BP: 116/78 139/84  149/82   Pulse: 77 110  118  Temp: 97.5 F (36.4 C) 97.5 F (36.4 C) 97.9 F (36.6 C)   TempSrc: Oral Oral Oral Oral  Resp: 16 20  18   Height:      Weight:      SpO2: 94% 95%  96%    Intake/Output Summary (Last 24 hours) at 07/11/12 0956 Last data filed at 07/11/12 0900  Gross per 24 hour  Intake   1160 ml  Output   1675 ml  Net   -515 ml   Filed Weights   07/09/12 0001  Weight: 60 kg (132 lb 4.4 oz)    Exam:   General: no CP, no SOB, denies feeling palpitations; rough night with bowel prep  Cardiovascular: tachycardic, irregular, no rubs or gallops  Respiratory: CTA bilaterally  Abdomen: soft, NT, ND, positive BS  Musculoskeletal: no edema, no cyanosis  Neuro: CN intact, MS 5/5 bilaterally, able to follow commands properly  Data Reviewed: Basic Metabolic Panel:  Recent Labs Lab 07/08/12 1828 07/08/12 1911 07/10/12 0600  NA 139 141 140  K 4.4 4.3 4.5  CL 100 99 103  CO2 34*  --  31  GLUCOSE 77 76 107*  BUN 18 17 13   CREATININE 1.16 1.20 1.04  CALCIUM 9.8  --  9.5   Liver Function Tests:  Recent Labs Lab 07/08/12 1828  AST 20  ALT 16  ALKPHOS 74  BILITOT 0.3  PROT 6.7  ALBUMIN 3.9   CBC:  Recent Labs Lab 07/08/12 1828 07/08/12 1911 07/10/12 0600 07/11/12 0652  WBC 12.0*  --  9.0 9.1  NEUTROABS 8.4*  --   --   --   HGB 15.6 15.6 14.6 14.2  HCT 44.3 46.0 43.9 41.8  MCV 92.9  --  94.6 93.9  PLT 161  --  155 152   Cardiac Enzymes:  Recent Labs Lab 07/08/12 1852  TROPONINI <0.30   CBG:  Recent Labs Lab 07/08/12 1841  GLUCAP 88    Recent Results (from the past 240 hour(s))  URINE CULTURE     Status: None   Collection Time    07/02/12  4:08 PM      Result Value Range Status   Specimen Description URINE, CATHETERIZED   Final   Special Requests NONE   Final   Culture  Setup Time 07/03/2012 05:00   Final   Colony Count NO GROWTH   Final   Culture NO GROWTH   Final   Report Status 07/04/2012 FINAL   Final     Studies: Mr  Brain Wo Contrast  07/09/2012   *RADIOLOGY REPORT*  Clinical Data:  Hyperlipidemic hypertensive patient presenting with slurred speech, ataxia and confusion.  MRI BRAIN WITHOUT CONTRAST MRA HEAD WITHOUT CONTRAST  Technique: Multiplanar, multiecho pulse sequences of the brain and surrounding structures were obtained according to standard protocol without intravenous contrast.  Angiographic images of the head were obtained using MRA technique without contrast.  Comparison: 07/08/2012 head CT.  MRI HEAD  Findings:  Motion degraded exam.  Tiny acute left pontomedullary junction infarct suspected.  No intracranial hemorrhage.  Remote right thalamic infarct.  Mild small vessel disease type changes.  Global atrophy without hydrocephalus.  Paranasal sinus mucosal thickening.  No intracranial mass lesion detected on this unenhanced exam.  Mild spinal stenosis C2-3  Cervical medullary junction, pituitary region, pineal region and orbital structures unremarkable.  IMPRESSION: Motion degraded exam.  Tiny acute left pontomedullary junction infarct suspected.  MRA HEAD  Findings: Motion degraded exam.  Markedly irregular narrowed vertebral arteries.  Basilar artery not visualized.  Nonvisualization AICAs and majority of PICAs.  High-grade tandem stenoses superior cerebellar arteries and posterior cerebral arteries bilaterally.  Mild irregularity and narrowing cavernous segment internal carotid artery bilaterally.  Moderate tandem stenoses A1 segment right anterior cerebral artery.  Marked narrowing of the left anterior cerebral artery A2 segment.  Mild to moderate narrowing M1 segment right middle cerebral artery with mild narrowing M1 segment left middle cerebral artery. Moderate to marked narrowing involving portions of the middle cerebral artery branch vessels bilaterally.  No obvious aneurysm.  IMPRESSION: Significant intracranial atherosclerotic type changes more notable involving the posterior circulation as discussed  above the  This has been made a PRA call report utilizing dashboard call feature.   Original Report Authenticated By: Lacy Duverney, M.D.   Mr Mra Head/brain Wo Cm  07/09/2012   *RADIOLOGY REPORT*  Clinical Data:  Hyperlipidemic hypertensive patient presenting with slurred speech, ataxia and confusion.  MRI BRAIN WITHOUT CONTRAST MRA HEAD WITHOUT CONTRAST  Technique: Multiplanar, multiecho pulse sequences of the brain and surrounding structures were obtained according to standard protocol without intravenous contrast.  Angiographic images of the head were obtained using MRA technique without contrast.  Comparison: 07/08/2012 head CT.  MRI HEAD  Findings:  Motion degraded exam.  Tiny acute left pontomedullary junction infarct suspected.  No intracranial hemorrhage.  Remote right thalamic infarct.  Mild small vessel disease type changes.  Global atrophy without hydrocephalus.  Paranasal sinus mucosal thickening.  No intracranial mass lesion detected on this unenhanced exam.  Mild spinal stenosis C2-3  Cervical medullary junction, pituitary region, pineal region and orbital structures unremarkable.  IMPRESSION: Motion degraded exam.  Tiny acute left pontomedullary junction infarct suspected.  MRA HEAD  Findings: Motion degraded exam.  Markedly irregular narrowed vertebral arteries.  Basilar artery not visualized.  Nonvisualization AICAs and majority of PICAs.  High-grade tandem stenoses superior cerebellar arteries and posterior cerebral arteries bilaterally.  Mild irregularity and narrowing cavernous segment internal carotid artery bilaterally.  Moderate tandem stenoses A1 segment right anterior cerebral artery.  Marked narrowing of the left anterior cerebral artery A2 segment.  Mild to moderate narrowing M1 segment right middle cerebral artery with mild narrowing M1 segment left middle cerebral artery. Moderate to marked narrowing involving portions of the middle cerebral artery branch vessels bilaterally.  No  obvious aneurysm.  IMPRESSION: Significant intracranial atherosclerotic type changes more notable involving the posterior circulation as discussed above the  This has been made a PRA call report utilizing dashboard call feature.   Original Report Authenticated By: Lacy Duverney, M.D.    Scheduled Meds: . ALPRAZolam  0.5-1 mg Oral TID PC & HS  . diltiazem  120 mg Oral Daily  . diltiazem  20 mg Intravenous Once  . feeding supplement  237 mL Oral BID BM  . finasteride  5 mg Oral Daily  . magnesium sulfate 1 - 4 g bolus IVPB  2 g Intravenous Once  . metoCLOPramide  5 mg Oral TID AC  . nicotine  7 mg Transdermal Daily  . polyethylene glycol  17 g Oral Daily  . Rivaroxaban  20 mg Oral Q supper  . simvastatin  5 mg Oral q1800  . tamsulosin  0.4 mg Oral QPC supper   Continuous Infusions: . sodium chloride 500 mL (07/11/12 0940)  . sodium chloride      Active Problems:   Acute urinary retention   Atrial fibrillation   HTN (hypertension), benign   CVA (cerebral infarction)   Protein-calorie malnutrition, severe    Time spent: >30 minutes   Cearra Portnoy  [facility] Pager [phone]. If 7PM-7AM, please contact night-coverage at [email or web address], password [facility] 07/11/2012, 9:56 AM  LOS: 3 days

## 2012-07-11 NOTE — Progress Notes (Signed)
Patient ID: Shannon Kirby, male   DOB: 1931-05-23, 79 y.o.   MRN: [id number] [facility] Gastroenterology Progress Note  Shannon Kirby 79 y.o. [birth year]   Subjective: Feels ok. Reports solid brown stool within past 15 min. Endoscopic procedures cancelled due to tachycardia in the 130's to 140's this AM. Managed with IV Cardizem dose and IV Mg by Dr. Gwenlyn Perking.  Objective: Vital signs: Filed Vitals:   07/11/12 1324  BP: 109/65  Pulse: 104  Temp: 97.9 F (36.6 C)  Resp: 18    Physical Exam: Gen: alert, no acute distress, elderly, frail Abd: soft, NT, ND  Lab Results:  Recent Labs  07/10/12 0600 07/11/12 1027  NA 140 140  K 4.5 4.7  CL 103 100  CO2 31 32  GLUCOSE 107* 117*  BUN 13 13  CREATININE 1.04 1.08  CALCIUM 9.5 9.4  MG  --  1.9    Recent Labs  07/08/12 1828  AST 20  ALT 16  ALKPHOS 74  BILITOT 0.3  PROT 6.7  ALBUMIN 3.9    Recent Labs  07/08/12 1828  07/10/12 0600 07/11/12 0652  WBC 12.0*  --  9.0 9.1  NEUTROABS 8.4*  --   --   --   HGB 15.6  < > 14.6 14.2  HCT 44.3  < > 43.9 41.8  MCV 92.9  --  94.6 93.9  PLT 161  --  155 152  < > = values in this interval not displayed.    Assessment/Plan: 79 yo with recent GI bleed likely lower source that is NOT actively bleeding now and Hgb stable. He is not adequately prepped for colonoscopy, which was not attempted due to Afib with tachycardia that has improved with treatment by Dr. Gwenlyn Perking. Will hold off on additional colon prep until tomorrow and plan to do colonoscopy +/- EGD Sunday if his cardiac status is stable.   Shannon Kirby C. 07/11/2012, 2:54 PM

## 2012-07-11 NOTE — Progress Notes (Signed)
Patient ID: Shannon Kirby, male   DOB: 1932/01/15, 79 y.o.   MRN: [id number]  Called by preop endo nurse that pt's HR going into the 130's at times in Afib. Called Dr. Gwenlyn Perking to evaluate. Having brown semi-solid and liquid stool in diaper. Will postpone colonoscopy due to inadequate prep and rapid HR. Hesitant to give additional prep right now due to tachycardia and will plan to do GI procs Sunday at earliest depending on his heart rate control. Do not advance diet beyond clear liquids. Discussed with Dr. Gwenlyn Perking.

## 2012-07-11 NOTE — OR Nursing (Signed)
Pt is in endoscopy pre-procedure for procedure with Dr. Bosie Clos.  Heart rhythm is afib as previously documented, 110s to 130s continuously.  Dr. Bosie Clos notified, stated he will page Dr. Gwenlyn Perking. Dr. Gwenlyn Perking to come see pt in endoscopy prior to procedure. Pt appears comfortable lying in bed, in no distress. Ennis Forts, RN

## 2012-07-12 DIAGNOSIS — F411 Generalized anxiety disorder: Secondary | ICD-10-CM

## 2012-07-12 MED ORDER — PEG 3350-KCL-NA BICARB-NACL 420 G PO SOLR: 4000.0000 mL | Freq: Once | ORAL | Status: DC

## 2012-07-12 MED ORDER — SODIUM CHLORIDE 0.9 % IV SOLN: INTRAVENOUS | Status: DC

## 2012-07-12 NOTE — Progress Notes (Signed)
Pt approx halfway done with Golytely, BMs now large, frequent, brown, and completely liquid  Minor, Morrie Sheldon Omarian Jaquith

## 2012-07-12 NOTE — Progress Notes (Signed)
Patient ID: Shannon Kirby, male   DOB: 09/27/1931, 79 y.o.   MRN: [id number] [facility] Gastroenterology Progress Note  Shannon Kirby 79 y.o. 05-09-1931   Subjective: Semi-solid/liquid brown stool X 1 overnight per nursing; Feels ok; Wants breakfast  Objective: Vital signs: Filed Vitals:   07/12/12 0600  BP: 116/46  Pulse: 83  Temp: 97.4 F (36.3 C)  Resp: 20    Physical Exam: Gen: alert, no acute distress, elderly  Abd: soft, nontender, nondistended, +BS  Lab Results:  Recent Labs  07/10/12 0600 07/11/12 1027  NA 140 140  K 4.5 4.7  CL 103 100  CO2 31 32  GLUCOSE 107* 117*  BUN 13 13  CREATININE 1.04 1.08  CALCIUM 9.5 9.4  MG  --  1.9   No results found for this basename: AST, ALT, ALKPHOS, BILITOT, PROT, ALBUMIN,  in the last 72 hours  Recent Labs  07/10/12 0600 07/11/12 0652  WBC 9.0 9.1  HGB 14.6 14.2  HCT 43.9 41.8  MCV 94.6 93.9  PLT 155 152      Assessment/Plan: 79 yo with GI bleed presumed lower that appears to have stopped. Hgb normal. Tachycardia yesterday caused postponement of endoscopic procedures. Will reprep today since not adequately prepped and do colonoscopy +/- EGD tomorrow (time TBD).   Shannon Burgner C. 07/12/2012, 7:45 AM

## 2012-07-12 NOTE — Progress Notes (Signed)
PT Cancellation Note  Patient Details Name: STEPHENS SHREVE MRN: [id number] DOB: [birth year]   Cancelled Treatment:    Reason Eval/Treat Not Completed: Medical issues which prohibited therapy, noted HR issues as documented in notes, will hold treatment for today.   Sigmond Patalano, [place] 07/12/2012, 1:32 PM

## 2012-07-12 NOTE — Progress Notes (Signed)
[facility] PROGRESS NOTE  Shannon Kirby ZOX:[id number] DOB: 07/08/1931 DOA: 07/08/2012 PCP: Shannon Hillier, MD  Assessment/Plan: 1-Left pontomedullary junction infarct:  *MRI of the brain 07/09/2012 Motion degraded exam. Tiny acute left pontomedullary junction infarct suspected.  *MRA of the brain 07/09/2012 Significant intracranial atherosclerotic type changes more notable involving the posterior circulation  *2D Echocardiogram 07/01/2012 EF 50-55% with no source of embolus.  *Carotid Doppler 07/09/2012 No evidence of hemodynamically significant internal carotid artery stenosis. Vertebral artery flow is antegrade.  -will continue xarelto after clear from GI given bloody stools -continue risk factors modifications.  2-HTN: stable: will continue home medications  3-Atrial fibrillation: HR uncontrolled this morning. -will continue cardizem and now metoprolol for better control -will check BMET and MG level in am for close follow up to electrolytes around bowel preparation   4-BPH: continue flomax and proscar  5-HLD: continue statins  6-Bloody stools: patient with family hx of colon cancer and never had colonoscopy. Most likely secondary to hemorrhoids given constipation and then bloody stools; but other etiologies includes diverticulosis, AVM's and malignancy. -colonoscopy plan for tomorrow.. -no further bloody BM according to patient. -Hgb remains stable in the 14 range -CBC in am  DVT: on xarelto (holding it for colonoscopy)  Code Status: Full Family Communication: daughter at bedside Disposition Plan: to be determine; patient expecting to go home with family at discharge.   Consultants:  neurology  Procedures:  See below for x-ray reports  Antibiotics:  none  HPI/Subjective: Afebrile, no CP, no SOB. Patient with no sustained episodes of v-tach and HR up to 120's while ambulating back and forth to the bathroom this morning for BM. Denies CP and is just feeling  tire.  Objective: Filed Vitals:   07/11/12 2200 07/12/12 0200 07/12/12 0600 07/12/12 1006  BP: 121/67 93/60 116/46 113/75  Pulse: 86 76 83 90  Temp: 97.8 F (36.6 C) 97.7 F (36.5 C) 97.4 F (36.3 C) 97.6 F (36.4 C)  TempSrc: Oral Oral Oral Oral  Resp: 20 20 20 19   Height:      Weight:      SpO2: 98% 96% 98% 97%    Intake/Output Summary (Last 24 hours) at 07/12/12 1328 Last data filed at 07/12/12 1300  Gross per 24 hour  Intake    480 ml  Output    350 ml  Net    130 ml   Filed Weights   07/09/12 0001  Weight: 60 kg (132 lb 4.4 oz)    Exam:   General: no CP, no SOB, denies feeling palpitations; reports some nausea and ongoing BM's with bowel prep  Cardiovascular: tachycardic, irregular, no rubs or gallops  Respiratory: CTA bilaterally  Abdomen: soft, NT, ND, positive BS  Musculoskeletal: no edema, no cyanosis  Neuro: CN intact, MS 5/5 bilaterally, able to follow commands properly  Data Reviewed: Basic Metabolic Panel:  Recent Labs Lab 07/08/12 1828 07/08/12 1911 07/10/12 0600 07/11/12 1027  NA 139 141 140 140  K 4.4 4.3 4.5 4.7  CL 100 99 103 100  CO2 34*  --  31 32  GLUCOSE 77 76 107* 117*  BUN 18 17 13 13   CREATININE 1.16 1.20 1.04 1.08  CALCIUM 9.8  --  9.5 9.4  MG  --   --   --  1.9   Liver Function Tests:  Recent Labs Lab 07/08/12 1828  AST 20  ALT 16  ALKPHOS 74  BILITOT 0.3  PROT 6.7  ALBUMIN 3.9   CBC:  Recent Labs Lab 07/08/12 1828 07/08/12 1911 07/10/12 0600 07/11/12 0652  WBC 12.0*  --  9.0 9.1  NEUTROABS 8.4*  --   --   --   HGB 15.6 15.6 14.6 14.2  HCT 44.3 46.0 43.9 41.8  MCV 92.9  --  94.6 93.9  PLT 161  --  155 152   Cardiac Enzymes:  Recent Labs Lab 07/08/12 1852  TROPONINI <0.30   CBG:  Recent Labs Lab 07/08/12 1841  GLUCAP 88    Recent Results (from the past 240 hour(s))  URINE CULTURE     Status: None   Collection Time    07/02/12  4:08 PM      Result Value Range Status   Specimen  Description URINE, CATHETERIZED   Final   Special Requests NONE   Final   Culture  Setup Time 07/03/2012 05:00   Final   Colony Count NO GROWTH   Final   Culture NO GROWTH   Final   Report Status 07/04/2012 FINAL   Final     Studies: No results found.  Scheduled Meds: . ALPRAZolam  0.5-1 mg Oral TID PC & HS  . diltiazem  120 mg Oral Daily  . feeding supplement  237 mL Oral BID BM  . finasteride  5 mg Oral Daily  . metoCLOPramide  5 mg Oral TID AC  . metoprolol tartrate  12.5 mg Oral BID  . nicotine  7 mg Transdermal Daily  . polyethylene glycol  17 g Oral Daily  . polyethylene glycol-electrolytes  4,000 mL Oral Once  . simvastatin  5 mg Oral q1800  . tamsulosin  0.4 mg Oral QPC supper   Continuous Infusions: . sodium chloride 500 mL (07/11/12 0940)  . sodium chloride    . sodium chloride    . sodium chloride      Active Problems:   Acute urinary retention   Atrial fibrillation   HTN (hypertension), benign   CVA (cerebral infarction)   Protein-calorie malnutrition, severe    Time spent: >30 minutes   Shannon Kirby  [facility] Pager [phone]. If 7PM-7AM, please contact night-coverage at [email or web address], password [facility] 07/12/2012, 1:28 PM  LOS: 4 days

## 2012-07-12 NOTE — Progress Notes (Signed)
Pt drinking 8oz golytely every 30 min since 2:45 pm, and has had 3 loose brown BMs so far  Minor, [employer]

## 2012-07-12 NOTE — Progress Notes (Addendum)
Patient with a-fib and HR of 104-126 on monitor, checked on patient, he is in restroom having a BM and then walking back to bed with NT. Patient said he felt tired. Paged MD just to let him be aware. Monitor tech stated he was having wide QRS complexes and occasional V tach and this was also paged to [place]. Awaiting response  Minor, Yvette Rack  Spoke with Dr Gwenlyn Perking, no new orders at this time

## 2012-07-13 ENCOUNTER — Encounter (HOSPITAL_COMMUNITY): Payer: Self-pay | Admitting: *Deleted

## 2012-07-13 ENCOUNTER — Encounter (HOSPITAL_COMMUNITY)
Admission: EM | Disposition: A | Discharge status: Home Health | Payer: Self-pay | Source: Home / Self Care | Attending: Internal Medicine

## 2012-07-13 DIAGNOSIS — K922 Gastrointestinal hemorrhage, unspecified: Secondary | ICD-10-CM | POA: Clinically undetermined

## 2012-07-13 HISTORY — PX: COLONOSCOPY: SHX5424

## 2012-07-13 LAB — BASIC METABOLIC PANEL
CO2: 28 mEq/L (ref 19–32)
Calcium: 8.8 mg/dL (ref 8.4–10.5)
Creatinine, Ser: 1.01 mg/dL (ref 0.50–1.35)
GFR calc non Af Amer: 69 mL/min — ABNORMAL LOW (ref >90)
Sodium: 137 mEq/L (ref 135–145)

## 2012-07-13 LAB — MAGNESIUM: Magnesium: 2 mg/dL (ref 1.5–2.5)

## 2012-07-13 SURGERY — COLONOSCOPY
Anesthesia: Moderate Sedation

## 2012-07-13 MED ORDER — ONDANSETRON HCL 4 MG/2ML IJ SOLN
4.0000 mg | Freq: Four times a day (QID) | INTRAMUSCULAR | Status: DC | PRN
  Administered 2012-07-13 (×2): 4 mg via INTRAVENOUS
  Filled 2012-07-13 (×2): qty 2

## 2012-07-13 MED ORDER — MIDAZOLAM HCL 5 MG/ML IJ SOLN
INTRAMUSCULAR | Status: AC
  Filled 2012-07-13: qty 3

## 2012-07-13 MED ORDER — MIDAZOLAM HCL 5 MG/5ML IJ SOLN
INTRAMUSCULAR | Status: DC | PRN
  Administered 2012-07-13: 2 mg via INTRAVENOUS
  Administered 2012-07-13 (×2): 1 mg via INTRAVENOUS
  Administered 2012-07-13: 2 mg via INTRAVENOUS
  Administered 2012-07-13: 1 mg via INTRAVENOUS

## 2012-07-13 MED ORDER — DIPHENHYDRAMINE HCL 50 MG/ML IJ SOLN
INTRAMUSCULAR | Status: AC
  Filled 2012-07-13: qty 1

## 2012-07-13 MED ORDER — FENTANYL CITRATE 0.05 MG/ML IJ SOLN
INTRAMUSCULAR | Status: DC | PRN
  Administered 2012-07-13 (×3): 25 ug via INTRAVENOUS

## 2012-07-13 MED ORDER — FENTANYL CITRATE 0.05 MG/ML IJ SOLN
INTRAMUSCULAR | Status: AC
  Filled 2012-07-13: qty 4

## 2012-07-13 NOTE — Progress Notes (Signed)
Physical Therapy Cancel Note   07/13/12 1500  PT Visit Information  Last PT Received On 07/13/12  Reason Eval/Treat Not Completed Patient at procedure or test/unavailable (Colonoscopy today)  Van Clines, PT [phone]

## 2012-07-13 NOTE — Progress Notes (Signed)
[facility] PROGRESS NOTE  LEVIE WAGES WUJ:[id number] DOB: [birth year] DOA: 07/08/2012 PCP: Mickie Hillier, MD  Assessment/Plan: 1-Left pontomedullary junction infarct:  *MRI of the brain 07/09/2012 Motion degraded exam. Tiny acute left pontomedullary junction infarct suspected.  *MRA of the brain 07/09/2012 Significant intracranial atherosclerotic type changes more notable involving the posterior circulation  *2D Echocardiogram 07/01/2012 EF 50-55% with no source of embolus.  *Carotid Doppler 07/09/2012 No evidence of hemodynamically significant internal carotid artery stenosis. Vertebral artery flow is antegrade.  -will continue xarelto after clear from GI given bloody stools (plan is to restart xarelto in 10 days) -continue risk factors modifications. Will follow PT.  2-HTN: stable: will continue home medications  3-Atrial fibrillation: HR uncontrolled this morning. -will continue cardizem and now metoprolol for better control -will check BMET and MG level in am for close follow up to electrolytes around bowel preparation   4-BPH: continue flomax and proscar  5-HLD: continue statins  6-Bloody stools: patient with family hx of colon cancer and never had colonoscopy. -no further bloody BM according to patient. -Hgb remains stable -colonoscopy demonstrated large rectal ulcer, internal hemorrhoids and some diverticulosis. -per GI rec's will hold anticoagulation for 10 days. -follow biopsy.  DVT: SCD's.  Code Status: Full Family Communication: daughter at bedside Disposition Plan: to be determine; patient expecting to go home with family at discharge.   Consultants:  neurology  Procedures:  See below for x-ray reports  Antibiotics:  none  HPI/Subjective: Afebrile, no CP, no SOB. Patient HR well controlled. Tolerated colonoscopy well.  Objective: Filed Vitals:   07/13/12 1000 07/13/12 1026 07/13/12 1414 07/13/12 1733  BP: 108/51 96/62 104/52 103/74  Pulse:  91  104 97  Temp:   97.8 F (36.6 C) 97.3 F (36.3 C)  TempSrc:   Oral Oral  Resp: 17 20 18 18   Height:      Weight:      SpO2: 95% 94% 99% 97%    Intake/Output Summary (Last 24 hours) at 07/13/12 1953 Last data filed at 07/13/12 1900  Gross per 24 hour  Intake    850 ml  Output   1300 ml  Net   -450 ml   Filed Weights   07/09/12 0001  Weight: 60 kg (132 lb 4.4 oz)    Exam:   General: no CP, no SOB, denies feeling palpitations; reports being tire after prep and colonoscopy  Cardiovascular: tachycardic, irregular, no rubs or gallops  Respiratory: CTA bilaterally  Abdomen: soft, NT, ND, positive BS  Musculoskeletal: no edema, no cyanosis  Neuro: CN intact, MS 5/5 bilaterally, able to follow commands properly  Data Reviewed: Basic Metabolic Panel:  Recent Labs Lab 07/08/12 1828 07/08/12 1911 07/10/12 0600 07/11/12 1027 07/13/12 0624  NA 139 141 140 140 137  K 4.4 4.3 4.5 4.7 4.4  CL 100 99 103 100 102  CO2 34*  --  31 32 28  GLUCOSE 77 76 107* 117* 96  BUN 18 17 13 13 11   CREATININE 1.16 1.20 1.04 1.08 1.01  CALCIUM 9.8  --  9.5 9.4 8.8  MG  --   --   --  1.9 2.0   Liver Function Tests:  Recent Labs Lab 07/08/12 1828  AST 20  ALT 16  ALKPHOS 74  BILITOT 0.3  PROT 6.7  ALBUMIN 3.9   CBC:  Recent Labs Lab 07/08/12 1828 07/08/12 1911 07/10/12 0600 07/11/12 0652  WBC 12.0*  --  9.0 9.1  NEUTROABS 8.4*  --   --   --  HGB 15.6 15.6 14.6 14.2  HCT 44.3 46.0 43.9 41.8  MCV 92.9  --  94.6 93.9  PLT 161  --  155 152   Cardiac Enzymes:  Recent Labs Lab 07/08/12 1852  TROPONINI <0.30   CBG:  Recent Labs Lab 07/08/12 1841  GLUCAP 88    Studies: No results found.  Scheduled Meds: . ALPRAZolam  0.5-1 mg Oral TID PC & HS  . diltiazem  120 mg Oral Daily  . feeding supplement  237 mL Oral BID BM  . finasteride  5 mg Oral Daily  . metoCLOPramide  5 mg Oral TID AC  . metoprolol tartrate  12.5 mg Oral BID  . nicotine  7 mg Transdermal  Daily  . polyethylene glycol  17 g Oral Daily  . simvastatin  5 mg Oral q1800  . tamsulosin  0.4 mg Oral QPC supper   Continuous Infusions: . sodium chloride 500 mL (07/11/12 0940)  . sodium chloride    . sodium chloride    . sodium chloride      Active Problems:   Acute urinary retention   Atrial fibrillation   HTN (hypertension), benign   CVA (cerebral infarction)   Protein-calorie malnutrition, severe   GI bleed   Time spent: >30 minutes   Jonnelle Lawniczak  [facility] Pager [phone]. If 7PM-7AM, please contact night-coverage at [email or web address], password [facility] 07/13/2012, 7:53 PM  LOS: 5 days

## 2012-07-13 NOTE — Interval H&P Note (Signed)
History and Physical Interval Note:  07/13/2012 9:18 AM  Shannon Kirby  has presented today for surgery, with the diagnosis of GI bleed   The various methods of treatment have been discussed with the patient and family. After consideration of risks, benefits and other options for treatment, the patient has consented to  Procedure(s) with comments: COLONOSCOPY (N/A) ESOPHAGOGASTRODUODENOSCOPY (EGD) (N/A) - possible EGD  as a surgical intervention .  The patient's history has been reviewed, patient examined, no change in status, stable for surgery.  I have reviewed the patient's chart and labs.  Questions were answered to the patient's satisfaction.     Teshara Moree C.

## 2012-07-13 NOTE — Op Note (Signed)
[facility] [address] [place], [address]   COLONOSCOPY PROCEDURE REPORT  PATIENT: Shannon Kirby, Shannon Kirby  MR#: [id number] BIRTHDATE: 1931/04/16 , 81  yrs. old GENDER: Male ENDOSCOPIST: Charlott Rakes, MD REFERRED [id number] team PROCEDURE DATE:  07/13/2012 PROCEDURE:   Colonoscopy with biopsy ASA CLASS:   Class III INDICATIONS:rectal bleeding. MEDICATIONS: Fentanyl 75 mcg IV and Versed 7 mg IV  DESCRIPTION OF PROCEDURE:   After the risks benefits and alternatives of the procedure were thoroughly explained, informed consent was obtained.  The Pentax Ped Colon [id number]  endoscope was introduced through the anus and advanced to the cecum, which was identified by both the appendix and ileocecal valve , limited by No adverse events experienced.   The quality of the prep was good. . The instrument was then slowly withdrawn as the colon was fully examined.     FINDINGS:  Rectal exam unremarkable.  Pediatric colonoscope inserted into the colon and advanced to the cecum, where the appendiceal orifice and ileocecal valve were identified.    The terminal ileum was intubated and was normal in appearance. On careful withdrawal of the colonoscope scattered diverticuli seen throughout the colon. Green liquid stool without any active bleeding seen. In the proximal rectum was a 5mm clean-based ulcer with heaped up edges and edema. In the distal rectum was a large ulcer with irregular borders and bleeding stigmata. Biopsies were taken of the mucosa near the ulcer.   Retroflexion revealed large ulcer previously noted as well as medium-sized internal hemorrhoids.  COMPLICATIONS: None  IMPRESSION:     1. Large rectal ulcer - s/p biopsies to rule out malignancy although could also be a stercoral ulcer from constipation 2. Scattered diverticulosis 3. Internal hemorrhoids 4. Bleeding resolved and due to ulcer  RECOMMENDATIONS:  F/U on path; Avoid NSAIDs; Miralax daily  to avoid constipation    ______________________________ eSigned:  Charlott Rakes, MD 07/13/2012 9:52 AM   CC:  PATIENT NAME:  Shannon Kirby, Shannon Kirby MR#: [id number]

## 2012-07-13 NOTE — H&P (View-Only) (Signed)
Patient ID: Shannon Kirby, male   DOB: 12/24/1931, 79 y.o.   MRN: [id number] [facility] Progress Note  Shannon Kirby 79 y.o. 12/30/1931   Subjective: Semi-solid/liquid brown stool X 1 overnight per nursing; Feels ok; Wants breakfast  Objective: Vital signs: Filed Vitals:   07/12/12 0600  BP: 116/46  Pulse: 83  Temp: 97.4 F (36.3 C)  Resp: 20    Physical Exam: Gen: alert, no acute distress, elderly  Abd: soft, nontender, nondistended, +BS  Lab Results:  Recent Labs  07/10/12 0600 07/11/12 1027  NA 140 140  K 4.5 4.7  CL 103 100  CO2 31 32  GLUCOSE 107* 117*  BUN 13 13  CREATININE 1.04 1.08  CALCIUM 9.5 9.4  MG  --  1.9   No results found for this basename: AST, ALT, ALKPHOS, BILITOT, PROT, ALBUMIN,  in the last 72 hours  Recent Labs  07/10/12 0600 07/11/12 0652  WBC 9.0 9.1  HGB 14.6 14.2  HCT 43.9 41.8  MCV 94.6 93.9  PLT 155 152      Assessment/Plan: 79 yo with GI bleed presumed lower that appears to have stopped. Hgb normal. Tachycardia yesterday caused postponement of endoscopic procedures. Will reprep today since not adequately prepped and do colonoscopy +/- EGD tomorrow (time TBD).   Shannon Kirby C. 07/12/2012, 7:45 AM  

## 2012-07-13 NOTE — Brief Op Note (Addendum)
Large rectal ulcer (not actively bleeding) but that is the source of the recent bleeding. DDx stercoral ulcer from constipation vs. Malignancy. Biopsies taken. See endopro for details. Avoid all anticoagulation for at least 2 weeks if ok with medicine team. May have bleeding today from biopsies that were taken. Unable to reach daughter by phone.

## 2012-07-14 DIAGNOSIS — E43 Unspecified severe protein-calorie malnutrition: Secondary | ICD-10-CM

## 2012-07-14 MED ORDER — RIVAROXABAN 20 MG PO TABS: 20.0000 mg | ORAL_TABLET | Freq: Every day | ORAL | Status: DC

## 2012-07-14 MED ORDER — POLYETHYLENE GLYCOL 3350 17 G PO PACK: 17.0000 g | PACK | Freq: Every day | ORAL | Status: DC

## 2012-07-14 MED ORDER — METOPROLOL SUCCINATE 12.5 MG HALF TABLET: 12.5000 mg | ORAL_TABLET | Freq: Every day | ORAL | Status: DC

## 2012-07-14 MED ORDER — ENSURE COMPLETE PO LIQD: 237.0000 mL | Freq: Two times a day (BID) | ORAL | Status: AC

## 2012-07-14 NOTE — Evaluation (Signed)
Physical Therapy Evaluation Patient Details Name: Shannon Kirby MRN: [id number] DOB: [birth year] Today's Date: 07/14/2012 Time: 6213-0865 PT Time Calculation (min): 11 min  PT Assessment / Plan / Recommendation Clinical Impression  Pt admitted for Left pontomedullary infarct, urinary retention, GIB and Afib. Pt currently at baseline functional level with 24hr assist available at home. Pt able to perform all basic mobility, denied any need for further therapy and agreeable to discharge from our service. No dizziness or difficulty throughout.     PT Assessment  Patent does not need any further PT services    Follow Up Recommendations  No PT follow up    Does the patient have the potential to tolerate intense rehabilitation      Barriers to Discharge        Equipment Recommendations  None recommended by PT    Recommendations for Other Services     Frequency      Precautions / Restrictions Precautions Precautions: None   Pertinent Vitals/Pain No pain      Mobility  Bed Mobility Supine to Sit: 7: Independent;HOB flat Sitting - Scoot to Edge of Bed: 7: Independent Transfers Sit to Stand: 6: Modified independent (Device/Increase time);From bed;From chair/3-in-1 Stand to Sit: 6: Modified independent (Device/Increase time);To chair/3-in-1 Ambulation/Gait Ambulation/Gait Assistance: 7: Independent Ambulation Distance (Feet): 1000 Feet Assistive device: None Ambulation/Gait Assistance Details: quick gait and no LOB including head turns and change of direction Gait Pattern: Within Functional Limits Gait velocity: WFL Stairs: Yes Stairs Assistance: 6: Modified independent (Device/Increase time) Stair Management Technique: One rail Left Number of Stairs: 4 Modified Rankin (Stroke Patients Only) Pre-Morbid Rankin Score: No symptoms Modified Rankin: No symptoms    Exercises     PT Diagnosis:    PT Problem List:   PT Treatment Interventions:     PT Goals    Visit  Information  Last PT Received On: 07/14/12 Assistance Needed: +1    Subjective Data  Subjective: I can do fine Patient Stated Goal: go home   Prior Functioning  Home Living Lives With: Daughter Available Help at Discharge: Family;Available 24 hours/day Type of Home: House Home Access: Stairs to enter [employer] of Steps: 5 Entrance Stairs-Rails: Left Home Layout: One level Bathroom Shower/Tub: Forensic scientist: Standard Bathroom Accessibility: Yes Home Adaptive Equipment: Walker - rolling Prior Function Level of Independence: Independent Able to Take Stairs?: Yes Driving: Yes Vocation: Retired Musician: No difficulties    Copywriter, advertising Arousal/Alertness: Awake/alert Behavior During Therapy: WFL for tasks assessed/performed Overall Cognitive Status: Within Functional Limits for tasks assessed    Extremity/Trunk Assessment Right Upper Extremity Assessment RUE ROM/Strength/Tone: [facility] for tasks assessed Left Upper Extremity Assessment LUE ROM/Strength/Tone: WFL for tasks assessed Right Lower Extremity Assessment RLE ROM/Strength/Tone: [facility] for tasks assessed Left Lower Extremity Assessment LLE ROM/Strength/Tone: WFL for tasks assessed Trunk Assessment Trunk Assessment: Kyphotic   Balance    End of Session PT - End of Session Equipment Utilized During Treatment: Gait belt Activity Tolerance: Patient tolerated treatment well Patient left: in chair;with call bell/phone within reach  GP     Delorse Lek 07/14/2012, 12:33 PM Delaney Meigs, PT [phone]

## 2012-07-14 NOTE — Discharge Summary (Signed)
Physician Discharge Summary  Shannon Kirby:[id number] DOB: 03/24/1931 DOA: 07/08/2012  PCP: Mickie Hillier, MD  Admit date: 07/08/2012 Discharge date: 07/14/2012  Time spent: >30 minutes  Recommendations for Outpatient Follow-up:  1. BMET to follow electrolytes and renal function 2. CBC to follow Hgb and guaranteed stability despite GI bleed during admission. 3. Will need to restart xarelto in 10 days to allow rectal ulcer to heal better. GI service [facility] will follow biopsy results and will communicate with patient when results available 4. Follow up with Dr. Anne Hahn in 2 months.  Discharge Diagnoses:  Active Problems:   Acute urinary retention   Atrial fibrillation   HTN (hypertension), benign   CVA (cerebral infarction)   Protein-calorie malnutrition, severe   GI bleed   Discharge Condition: Stable and improved. Will discharge home with family care and continuation of previous home health services.  Diet recommendation: Heart healthy diet  Filed Weights   07/09/12 0001  Weight: 60 kg (132 lb 4.4 oz)    History of present illness:  79 y.o. male has a past medical history of Hypertension; Hernia; Hyperlipidemia; Pre-diabetes; and A-fib.  Presented with 1 week hx of urinary retention he was admitted to [facility] and had a foley place discharged to home. At that time he was also diagnosed with UTI and treated with 3 day course. He was readmitted for dehydration and was found to be in A.fib and started xarelto. Today patient went to urology for follow up and was strated on proscar and repaflo. He went home and took a nap at 3 pm he woke up at 4:30 with slurred speech, ataxia, confusion, falling over. He was taken to [facility] ED. Code stroke was called but he was not a candidate for tPA due to being on xarelto. CT scan showed 4 mm hypodensity in the right thalamus likely reflects a lacunar infarction. Currently his slurred speech is still present.    Hospital Course:  1-Left pontomedullary  junction infarct:  *MRI of the brain 07/09/2012 Motion degraded exam. Tiny acute left pontomedullary junction infarct suspected.  *MRA of the brain 07/09/2012 Significant intracranial atherosclerotic type changes more notable involving the posterior circulation  *2D Echocardiogram 07/01/2012 EF 50-55% with no source of embolus.  *Carotid Doppler 07/09/2012 No evidence of hemodynamically significant internal carotid artery stenosis. Vertebral artery flow is antegrade.  -will continue xarelto after clear from GI given bloody stools (plan is to restart xarelto in 10 days)  -continue risk factors modifications.  -home health services  2-HTN: stable: will continue home medications.   3-Atrial fibrillation: HR uncontrolled around bowel preparation for colonoscopy. Stable at discharge with use cardizem and metoprolol.  4-BPH: continue flomax and proscar   5-HLD: continue statins   6-Bloody stools: patient with family hx of colon cancer and never had colonoscopy.  -no further bloody BM according to patient.  -Hgb remains stable  -colonoscopy demonstrated large rectal ulcer, internal hemorrhoids and some diverticulosis.  -per GI rec's will hold anticoagulation for 10 days and then will resume xarelto and follow medical response.  [facility] will follow biopsy results.   Procedures:  Colonoscopy: positive for rectal ulcer, internal hemorrhoids and diverticulosis  2D Echocardiogram 07/01/2012 EF 50-55% with no source of embolus, WMA or significant valvular defects..   Consultations:  Neurology  GI  PT/OT  Discharge Exam: Filed Vitals:   07/14/12 0226 07/14/12 0602 07/14/12 0924 07/14/12 1442  BP: 92/63 95/57 114/70 97/39  Pulse: 60 81 83 88  Temp: 97.6 F (  36.4 C) 97.8 F (36.6 C) 98.1 F (36.7 C) 98 F (36.7 C)  TempSrc: Oral Oral Oral Oral  Resp: 17 17 20 20   Height:      Weight:      SpO2: 95% 96% 98% 98%   General: no CP, no SOB, denies feeling palpitations; reports being  tire after prep and colonoscopy  Cardiovascular: tachycardic, irregular, no rubs or gallops  Respiratory: CTA bilaterally  Abdomen: soft, NT, ND, positive BS  Musculoskeletal: no edema, no cyanosis  Neuro: CN intact, MS 5/5 bilaterally, able to follow commands properly  Discharge Instructions  Discharge Orders   Future Orders Complete By Expires     Discharge instructions  As directed     Comments:      Keep yourself hydrated Take medications as prescribed Please arrange follow up with PCP in 10 days Restart xarelto on 07/24/12 as indicated    Home Health  As directed     Scheduling Instructions:      Resume    Comments:      Please resume previous home health services.    Questions:      To provide the following care/treatments:  PT    OT    RN        Medication List    TAKE these medications       ALPRAZolam 1 MG tablet  Commonly known as:  XANAX  Take 0.5-1 mg by mouth 4 (four) times daily - after meals and at bedtime. 0.5mg  tid and 1mg  qhs     diltiazem 120 MG 24 hr capsule  Commonly known as:  CARDIZEM CD  Take 1 capsule (120 mg total) by mouth daily.     feeding supplement Liqd  Take 237 mLs by mouth 2 (two) times daily between meals.     finasteride 5 MG tablet  Commonly known as:  PROSCAR  Take 5 mg by mouth daily.     metoprolol succinate 12.5 mg Tb24  Commonly known as:  TOPROL-XL  Take 0.5 tablets (12.5 mg total) by mouth daily.  Start taking on:  07/15/2012     polyethylene glycol packet  Commonly known as:  MIRALAX / GLYCOLAX  Take 17 g by mouth daily.     pravastatin 10 MG tablet  Commonly known as:  PRAVACHOL  Take 10 mg by mouth every evening.     RAPAFLO 8 MG Caps capsule  Generic drug:  silodosin  Take 8 mg by mouth daily with breakfast.     Rivaroxaban 20 MG Tabs  Commonly known as:  XARELTO  Take 1 tablet (20 mg total) by mouth daily with supper. Hold it for 10 days and then start taking medication as previously prescribed.  Start  taking on:  07/24/2012       No Known Allergies     Follow-up Information   Follow up with Lesly Dukes, MD. Schedule an appointment as soon as possible for a visit in 2 months. (stroke clinic)    Contact information:   [address] [place] [address] [phone]       Follow up with Mickie Hillier, MD. Schedule an appointment as soon as possible for a visit in 10 days.   Contact information:   [address] [place] [address] [phone]        The results of significant diagnostics from this hospitalization (including imaging, microbiology, ancillary and laboratory) are listed below for reference.    Significant Diagnostic Studies:  Dg Chest 2 View  07/02/2012   *RADIOLOGY REPORT*  Clinical Data: Hypotension, syncope, and weakness  CHEST - 2 VIEW  Comparison: Chest radiograph 06/30/2012  Findings: Normal mediastinum and cardiac silhouette.  Normal pulmonary  vasculature.  No evidence of effusion, infiltrate, or pneumothorax.  No acute bony abnormality. Degenerative osteophytosis of the thoracic spine.  IMPRESSION: No acute cardiopulmonary process.   Original Report Authenticated By: Genevive Bi, M.D.   Ct Head Wo Contrast  07/08/2012   *RADIOLOGY REPORT*  Clinical Data: Slurred speech and confusion with unsteady gait. History of hypertension and atrial fibrillation.  CT HEAD WITHOUT CONTRAST  Technique:  Contiguous axial images were obtained from the base of the skull through the vertex without contrast.  Comparison: None.  Findings: No acute intracranial abnormality is identified. Specifically, no hemorrhage, hydrocephalus, mass effect, mass lesion, or evidence of acute cortically based infarction is identified.  4 mm right thalamic hypodensity likely reflects a prior lacunar infarction.  No prior imaging studies for comparison.  Mild proptosis bilaterally.  Skull intact.  Visualized paranasal sinuses and mastoid air cells clear.  IMPRESSION:   1. 4 mm hypodensity in the right thalamus likely reflects a lacunar infarction.  Age of the infarction is uncertain 2. Negative for hemorrhage, hydrocephalus, midline shift, or evidence of acute cortically-based large territory infarction.  Findings discussed with Dr. Roseanne Reno of neurology 6:43 p.m. 07/08/2012.   Original Report Authenticated By: Britta Mccreedy, M.D.   Mr Brain Wo Contrast  07/09/2012   *RADIOLOGY REPORT*  Clinical Data:  Hyperlipidemic hypertensive patient presenting with slurred speech, ataxia and confusion.  MRI BRAIN WITHOUT CONTRAST MRA HEAD WITHOUT CONTRAST  Technique: Multiplanar, multiecho pulse sequences of the brain and surrounding structures were obtained according to standard protocol without intravenous contrast.  Angiographic images of the head were obtained using MRA technique without contrast.  Comparison: 07/08/2012 head CT.  MRI HEAD  Findings:  Motion degraded exam.  Tiny acute left pontomedullary junction infarct suspected.  No intracranial hemorrhage.  Remote right thalamic infarct.  Mild small vessel disease type changes.  Global atrophy without hydrocephalus.  Paranasal sinus mucosal thickening.  No intracranial mass lesion detected on this unenhanced exam.  Mild spinal stenosis C2-3  Cervical medullary junction, pituitary region, pineal region and orbital structures unremarkable.  IMPRESSION: Motion degraded exam.  Tiny acute left pontomedullary junction infarct suspected.  MRA HEAD  Findings: Motion degraded exam.  Markedly irregular narrowed vertebral arteries.  Basilar artery not visualized.  Nonvisualization AICAs and majority of PICAs.  High-grade tandem stenoses superior cerebellar arteries and posterior cerebral arteries bilaterally.  Mild irregularity and narrowing cavernous segment internal carotid artery bilaterally.  Moderate tandem stenoses A1 segment right anterior cerebral artery.  Marked narrowing of the left anterior cerebral artery A2 segment.  Mild to  moderate narrowing M1 segment right middle cerebral artery with mild narrowing M1 segment left middle cerebral artery. Moderate to marked narrowing involving portions of the middle cerebral artery branch vessels bilaterally.  No obvious aneurysm.  IMPRESSION: Significant intracranial atherosclerotic type changes more notable involving the posterior circulation as discussed above the  This has been made a PRA call report utilizing dashboard call feature.   Original Report Authenticated By: Lacy Duverney, M.D.   Dg Chest Port 1 View  07/03/2012   *RADIOLOGY REPORT*  Clinical Data: Nausea vomiting weakness.  Short of breath  PORTABLE CHEST - 1 VIEW  Comparison: 07/02/2012  Findings: COPD with pulmonary hyperinflation and hyperlucency. Mild scarring in the lung bases.  Negative  for pneumonia.  Negative for heart failure or mass lesion.  IMPRESSION: COPD without acute abnormality.   Original Report Authenticated By: Janeece Riggers, M.D.   Dg Chest Port 1 View  06/30/2012   *RADIOLOGY REPORT*  Clinical Data: Dysuria, difficulty urinating, dizziness, history asthma, hypertension  PORTABLE CHEST - 1 VIEW  Comparison: Portable exam 1025 hours compared to 11/09/2011  Findings: Normal heart size, mediastinal contours, and pulmonary vascularity. Skin folds project over chest bilaterally. Emphysematous and bronchitic changes consistent with COPD. Minimal atelectasis at right base. No acute infiltrate, pleural effusion or pneumothorax. Bones appear demineralized.  IMPRESSION: COPD changes with minimal right basilar atelectasis. No acute infiltrate.   Original Report Authenticated By: Ulyses Southward, M.D.   Dg Abd Portable 2v  07/03/2012   *RADIOLOGY REPORT*  Clinical Data: Nausea vomiting and weakness.  PORTABLE ABDOMEN - 2 VIEW  Comparison: None  Findings: Negative for bowel obstruction.  No free air.  No acute bony abnormality.  No kidney stones.  Vascular calcifications in the pelvis.  IMPRESSION: No acute abnormality.    Original Report Authenticated By: Janeece Riggers, M.D.   Mr Mra Head/brain Wo Cm  07/09/2012   *RADIOLOGY REPORT*  Clinical Data:  Hyperlipidemic hypertensive patient presenting with slurred speech, ataxia and confusion.  MRI BRAIN WITHOUT CONTRAST MRA HEAD WITHOUT CONTRAST  Technique: Multiplanar, multiecho pulse sequences of the brain and surrounding structures were obtained according to standard protocol without intravenous contrast.  Angiographic images of the head were obtained using MRA technique without contrast.  Comparison: 07/08/2012 head CT.  MRI HEAD  Findings:  Motion degraded exam.  Tiny acute left pontomedullary junction infarct suspected.  No intracranial hemorrhage.  Remote right thalamic infarct.  Mild small vessel disease type changes.  Global atrophy without hydrocephalus.  Paranasal sinus mucosal thickening.  No intracranial mass lesion detected on this unenhanced exam.  Mild spinal stenosis C2-3  Cervical medullary junction, pituitary region, pineal region and orbital structures unremarkable.  IMPRESSION: Motion degraded exam.  Tiny acute left pontomedullary junction infarct suspected.  MRA HEAD  Findings: Motion degraded exam.  Markedly irregular narrowed vertebral arteries.  Basilar artery not visualized.  Nonvisualization AICAs and majority of PICAs.  High-grade tandem stenoses superior cerebellar arteries and posterior cerebral arteries bilaterally.  Mild irregularity and narrowing cavernous segment internal carotid artery bilaterally.  Moderate tandem stenoses A1 segment right anterior cerebral artery.  Marked narrowing of the left anterior cerebral artery A2 segment.  Mild to moderate narrowing M1 segment right middle cerebral artery with mild narrowing M1 segment left middle cerebral artery. Moderate to marked narrowing involving portions of the middle cerebral artery branch vessels bilaterally.  No obvious aneurysm.  IMPRESSION: Significant intracranial atherosclerotic type changes more  notable involving the posterior circulation as discussed above the  This has been made a PRA call report utilizing dashboard call feature.   Original Report Authenticated By: Lacy Duverney, M.D.   Labs: Basic Metabolic Panel:  Recent Labs Lab 07/08/12 1828 07/08/12 1911 07/10/12 0600 07/11/12 1027 07/13/12 0624  NA 139 141 140 140 137  K 4.4 4.3 4.5 4.7 4.4  CL 100 99 103 100 102  CO2 34*  --  31 32 28  GLUCOSE 77 76 107* 117* 96  BUN 18 17 13 13 11   CREATININE 1.16 1.20 1.04 1.08 1.01  CALCIUM 9.8  --  9.5 9.4 8.8  MG  --   --   --  1.9 2.0   Liver Function Tests:  Recent Labs Lab 07/08/12 1828  AST 20  ALT 16  ALKPHOS 74  BILITOT 0.3  PROT 6.7  ALBUMIN 3.9   CBC:  Recent Labs Lab 07/08/12 1828 07/08/12 1911 07/10/12 0600 07/11/12 0652  WBC 12.0*  --  9.0 9.1  NEUTROABS 8.4*  --   --   --   HGB 15.6 15.6 14.6 14.2  HCT 44.3 46.0 43.9 41.8  MCV 92.9  --  94.6 93.9  PLT 161  --  155 152   Cardiac Enzymes:  Recent Labs Lab 07/08/12 1852  TROPONINI <0.30   CBG:  Recent Labs Lab 07/08/12 1841  GLUCAP 88    Signed:  Naziah Weckerly  [facility] 07/14/2012, 3:01 PM

## 2012-07-14 NOTE — Progress Notes (Signed)
The patient denies any further rectal bleeding. Clinically appears stable. We will sign off. Call us if needed.

## 2012-07-14 NOTE — Progress Notes (Signed)
[facility]  Patient Status: Active (receiving services up to time of hospitalization)  [facility] is providing the following services: RN, PT and OT  If patient discharges after hours, please call [phone].   Wynelle Bourgeois 07/14/2012, 2:34 PM

## 2012-07-15 ENCOUNTER — Encounter (HOSPITAL_COMMUNITY): Payer: Self-pay | Admitting: Gastroenterology

## 2012-08-07 ENCOUNTER — Emergency Department (HOSPITAL_COMMUNITY): Payer: No Typology Code available for payment source

## 2012-08-07 ENCOUNTER — Emergency Department (HOSPITAL_COMMUNITY)
Admission: EM | Admit: 2012-08-07 | Discharge: 2012-08-07 | Disposition: A | Discharge status: Home / Self Care | Payer: No Typology Code available for payment source | Attending: Emergency Medicine | Admitting: Emergency Medicine

## 2012-08-07 ENCOUNTER — Encounter (HOSPITAL_COMMUNITY): Payer: Self-pay

## 2012-08-07 DIAGNOSIS — R5381 Other malaise: Secondary | ICD-10-CM | POA: Insufficient documentation

## 2012-08-07 DIAGNOSIS — Z862 Personal history of diseases of the blood and blood-forming organs and certain disorders involving the immune mechanism: Secondary | ICD-10-CM | POA: Insufficient documentation

## 2012-08-07 DIAGNOSIS — F17200 Nicotine dependence, unspecified, uncomplicated: Secondary | ICD-10-CM | POA: Insufficient documentation

## 2012-08-07 DIAGNOSIS — R51 Headache: Secondary | ICD-10-CM | POA: Insufficient documentation

## 2012-08-07 DIAGNOSIS — R110 Nausea: Secondary | ICD-10-CM | POA: Insufficient documentation

## 2012-08-07 DIAGNOSIS — Z79899 Other long term (current) drug therapy: Secondary | ICD-10-CM | POA: Insufficient documentation

## 2012-08-07 DIAGNOSIS — I10 Essential (primary) hypertension: Secondary | ICD-10-CM | POA: Insufficient documentation

## 2012-08-07 DIAGNOSIS — E785 Hyperlipidemia, unspecified: Secondary | ICD-10-CM | POA: Insufficient documentation

## 2012-08-07 DIAGNOSIS — Z8719 Personal history of other diseases of the digestive system: Secondary | ICD-10-CM | POA: Insufficient documentation

## 2012-08-07 DIAGNOSIS — Z8673 Personal history of transient ischemic attack (TIA), and cerebral infarction without residual deficits: Secondary | ICD-10-CM | POA: Insufficient documentation

## 2012-08-07 DIAGNOSIS — I4891 Unspecified atrial fibrillation: Secondary | ICD-10-CM | POA: Insufficient documentation

## 2012-08-07 DIAGNOSIS — Z8639 Personal history of other endocrine, nutritional and metabolic disease: Secondary | ICD-10-CM | POA: Insufficient documentation

## 2012-08-07 DIAGNOSIS — R5383 Other fatigue: Secondary | ICD-10-CM

## 2012-08-07 LAB — URINALYSIS, ROUTINE W REFLEX MICROSCOPIC
Glucose, UA: NEGATIVE mg/dL
Leukocytes, UA: NEGATIVE
Specific Gravity, Urine: 1.014 (ref 1.005–1.030)
pH: 6.5 (ref 5.0–8.0)

## 2012-08-07 LAB — CBC WITH DIFFERENTIAL/PLATELET
Basophils Absolute: 0 10*3/uL (ref 0.0–0.1)
HCT: 48.1 % (ref 39.0–52.0)
Hemoglobin: 16.1 g/dL (ref 13.0–17.0)
Lymphocytes Relative: 12 % (ref 12–46)
Monocytes Absolute: 0.5 10*3/uL (ref 0.1–1.0)
Neutro Abs: 5.4 10*3/uL (ref 1.7–7.7)
RDW: 12.7 % (ref 11.5–15.5)
WBC: 7 10*3/uL (ref 4.0–10.5)

## 2012-08-07 LAB — APTT: aPTT: 34 seconds (ref 24–37)

## 2012-08-07 LAB — BASIC METABOLIC PANEL
CO2: 29 mEq/L (ref 19–32)
Chloride: 101 mEq/L (ref 96–112)
Creatinine, Ser: 1.18 mg/dL (ref 0.50–1.35)

## 2012-08-07 LAB — RAPID URINE DRUG SCREEN, HOSP PERFORMED
Benzodiazepines: POSITIVE — AB
Cocaine: NOT DETECTED
Opiates: NOT DETECTED

## 2012-08-07 LAB — PROTIME-INR
INR: 1.31 (ref 0.00–1.49)
Prothrombin Time: 16 seconds — ABNORMAL HIGH (ref 11.6–15.2)

## 2012-08-07 LAB — URINE MICROSCOPIC-ADD ON

## 2012-08-07 NOTE — ED Notes (Addendum)
Pt. Is alert and oriented X4, neurologically intact.  Pt. Just ate breakfast prior to coming Denies any pain.  Pts. Family was worried that he was not acting him self.  Pt. Is recovering from a Recent stroke. Family is very anxious. Reported by [facility].  Pt. Denies any sob, n/v/d  Pt. Ambulated to the ambulance gait steady,

## 2012-08-07 NOTE — ED Provider Notes (Signed)
Medical screening examination/treatment/procedure(s) were conducted as a shared visit with non-physician practitioner(s) and myself.  I personally evaluated the patient during the encounter 79 yo man with prior Hx CVA in April, whom his daughter thinks is not "acting right."  Exam shows pleasant elderly man in no distress.  NSR, neurologically intact.  Lab workup negative.  Reassured and released.  Carleene Cooper III, MD 08/07/12 [phone]

## 2012-08-07 NOTE — ED Provider Notes (Signed)
History    CSN: [id number] Arrival date & time 08/07/12  5621  First MD Initiated Contact with Patient 08/07/12 0845     Chief Complaint  Patient presents with  . Follow-up   (Consider location/radiation/quality/duration/timing/severity/associated sxs/prior Treatment) HPI Comments: Patient is a 79 year old male with a history of CVA on 07/08/2012 as well as atrial fibrillation, patient currently on Xarelto, HTN, and HLD who presents from home with his daughter. Currently lives in a house currently occupied by himself and his daughter. Daughter brought patient as she feels he has not been acting right since hospital d/c post CVA. She states he has been c/o intermittent headaches and intermittent nausea. She also states that he has been more fatigued than his baseline. Daughter and patient admit to compliance with current medication regimen. Patient, himself, with no medical complaints and states that he feels fine; endorses being asymptomatic at present. Patient with neurologic follow up 1 week ago with reassuring examination. Daughter and patient deny vision changes, slurred speech, decreased muscle strength, and dizziness or increased falls. Patient A&Ox4.  The history is provided by the patient. No language interpreter was used.   Past Medical History  Diagnosis Date  . Hypertension   . Hernia     L groin   . Hyperlipidemia   . Pre-diabetes   . A-fib   . Stroke    Past Surgical History  Procedure Laterality Date  . No past surgeries    . Tonsillectomy and adenoidectomy  age 37  . Colonoscopy N/A 07/13/2012    Procedure: COLONOSCOPY;  Surgeon: Shirley Friar, MD;  Location: [facility] ENDOSCOPY;  Service: Endoscopy;  Laterality: N/A;   Family History  Problem Relation Age of Onset  . Other Mother     Passed at 48 of "blood clot in chest"  . Colon cancer Father     Passed at 56  . Heart attack Sister     Passed away 42   History  Substance Use Topics  . Smoking status:  Current Every Day Smoker -- 1.00 packs/day for 63 years  . Smokeless tobacco: Never Used     Comment: Smoked since age 23  . Alcohol Use: No    Review of Systems  Constitutional: Positive for fatigue.  Gastrointestinal: Positive for nausea.  Neurological: Positive for headaches (none at present).  All other systems reviewed and are negative.    Allergies  Review of patient's allergies indicates no known allergies.  Home Medications   Current Outpatient Rx  Name  Route  Sig  Dispense  Refill  . ALPRAZolam (XANAX) 1 MG tablet   Oral   Take 0.5-1 mg by mouth 4 (four) times daily - after meals and at bedtime. Take half of a table three times a day and a whole tablet at bedtime.         Marland Kitchen diltiazem (CARDIZEM CD) 120 MG 24 hr capsule   Oral   Take 1 capsule (120 mg total) by mouth daily.   30 capsule   1   . feeding supplement (ENSURE COMPLETE) LIQD   Oral   Take 237 mLs by mouth 2 (two) times daily between meals.         . finasteride (PROSCAR) 5 MG tablet   Oral   Take 5 mg by mouth daily.         . metoprolol succinate (TOPROL-XL) 12.5 mg TB24   Oral   Take 0.5 tablets (12.5 mg total) by mouth daily.   30  tablet   1   . polyethylene glycol (MIRALAX / GLYCOLAX) packet   Oral   Take 17 g by mouth daily.   30 each   2   . pravastatin (PRAVACHOL) 10 MG tablet   Oral   Take 10 mg by mouth every evening.         . Rivaroxaban (XARELTO) 20 MG TABS   Oral   Take 1 tablet (20 mg total) by mouth daily with supper. Hold it for 10 days and then start taking medication as previously prescribed.         . silodosin (RAPAFLO) 8 MG CAPS capsule   Oral   Take 8 mg by mouth daily with breakfast.          BP 149/82  Pulse 76  Temp(Src) 97.2 F (36.2 C) (Oral)  Resp 18  SpO2 98%  Physical Exam  Nursing note and vitals reviewed. Constitutional: He is oriented to person, place, and time. He appears well-developed and well-nourished. No distress.  HENT:   Head: Normocephalic and atraumatic.  Mouth/Throat: Oropharynx is clear and moist. No oropharyngeal exudate.  Symmetric rise of the uvula with phonation  Eyes: Conjunctivae and EOM are normal. Pupils are equal, round, and reactive to light. No scleral icterus.  No nystagmus  Neck: Normal range of motion. Neck supple.  Cardiovascular: Normal rate, regular rhythm, normal heart sounds and intact distal pulses.   Pulmonary/Chest: Effort normal and breath sounds normal. No respiratory distress. He has no wheezes. He has no rales.  Abdominal: Soft. He exhibits no distension. There is no tenderness. There is no rebound and no guarding.  Lymphadenopathy:    He has no cervical adenopathy.  Neurological: He is alert and oriented to person, place, and time. He has normal reflexes. No cranial nerve deficit. Coordination normal.  Patient speaks in full goal oriented sentences and answers questions appropriately. Cranial nerves III-XII grossly intact. Patient has equal grip strength bilaterally with 5 out of 5 strength against resistance in his upper and lower extremities. DTRs normal and symmetric. No sensory or motor deficits appreciated and patient moves extremities without ataxia. Able to pick dime up off flat surface with both hands unassisted.  Skin: Skin is warm and dry. No rash noted. He is not diaphoretic. No erythema. No pallor.  Psychiatric: He has a normal mood and affect. His behavior is normal.   ED Course  Procedures (including critical care time) Labs Reviewed  BASIC METABOLIC PANEL - Abnormal; Notable for the following:    Glucose, Bld 152 (*)    GFR calc non Af Amer 57 (*)    GFR calc Af Amer 66 (*)    All other components within normal limits  URINALYSIS, ROUTINE W REFLEX MICROSCOPIC - Abnormal; Notable for the following:    Color, Urine ORANGE (*)    Nitrite POSITIVE (*)    All other components within normal limits  PROTIME-INR - Abnormal; Notable for the following:    Prothrombin  Time 16.0 (*)    All other components within normal limits  URINE RAPID DRUG SCREEN (HOSP PERFORMED) - Abnormal; Notable for the following:    Benzodiazepines POSITIVE (*)    All other components within normal limits  URINE CULTURE  CBC WITH DIFFERENTIAL  APTT  ETHANOL  URINE MICROSCOPIC-ADD ON   Ct Head Wo Contrast  08/07/2012   *RADIOLOGY REPORT*  Clinical Data: Recent stroke.  Altered mental status.  Follow-up.  CT HEAD WITHOUT CONTRAST  Technique:  Contiguous axial  images were obtained from the base of the skull through the vertex without contrast.  Comparison: MRI 07/09/2012.  CT 07/08/2012  Findings: Generalized atrophy.  Mild chronic microvascular ischemic change in the white matter.  Small chronic infarct right thalamus medially is unchanged.  Negative for acute infarct.  Negative for hemorrhage or mass lesion.  Calvarium is intact.  IMPRESSION: Atrophy and chronic microvascular ischemia.  No acute abnormality.   Original Report Authenticated By: Janeece Riggers, M.D.   1. Fatigue    MDM  79 year old male with a history of CVA 1 month ago. Daughter brings patient to ED for evaluation because she states he has been complaining of fatigue, nausea and frequent headaches. Patient denies any complaints on arrival and states he is asymptomatic at this time. Physical exam elicited no focal neurologic deficits; patient A&Ox4. He is currently on blood thinners. Workup to include CBC, BMP, APTT, PT/INR, ethanol level, urine drug screen, and urinalysis. CT head without contrast ordered to rule out any acute processes.  Patient's last without leukocytosis, anemia, hemoconcentration, or electrolyte imbalance. Kidney function preserved. Urine drug screen positive for benzodiazepines; ethanol level undetectable. Urinalysis nitrate positive. I appreciate this to be a false positive secondary to urine color given lack of bacteria and leukocytes. CT head without contrast also without evidence of acute  hemorrhage, hydrocephalus, mass lesion, or acute infarct. Patient has remained well and nontoxic appearing and hemodynamically stable. Given workup, believe patient is appropriate for discharge with primary care and neurology followup for further evaluation of symptoms. Patient mildly hypertensive during ED stay. Daughter is concerned about this; have recommended followup with the patient's cardiologist and BP recheck. Indications for ED return provided and both patient and daughter are agreeable to plan. Patient seen also by Dr. Ignacia Palma who is in agreement with this workup, assessment, and patient's stability for discharge.   Filed Vitals:   08/07/12 0840 08/07/12 1031 08/07/12 1033 08/07/12 1100  BP: 155/91 146/82  149/82  Pulse: 93  98 76  Temp: 97.2 F (36.2 C)     TempSrc: Oral     Resp: 18     SpO2: 98%  98% 98%     Antony Madura, PA-C 08/07/12 1656

## 2012-08-07 NOTE — ED Notes (Signed)
Discharge and follow up instructions reviewed with pt. Pt verbalized understanding.  

## 2012-08-07 NOTE — ED Notes (Signed)
Patient transported to CT 

## 2012-08-07 NOTE — ED Notes (Addendum)
Blood being drawn; urinal given for sample.

## 2012-08-08 LAB — URINE CULTURE

## 2012-08-11 ENCOUNTER — Encounter: Payer: Self-pay | Admitting: Nurse Practitioner

## 2012-08-11 ENCOUNTER — Ambulatory Visit (INDEPENDENT_AMBULATORY_CARE_PROVIDER_SITE_OTHER): Payer: No Typology Code available for payment source | Admitting: Nurse Practitioner

## 2012-08-11 VITALS — BP 130/68 | HR 80 | Ht 75.0 in | Wt 134.8 lb

## 2012-08-11 DIAGNOSIS — I4891 Unspecified atrial fibrillation: Secondary | ICD-10-CM

## 2012-08-11 NOTE — Patient Instructions (Addendum)
Stay on your current medicines  We will see you back in 2 to 3 months  Call the [facility] office at [phone] if you have any questions, problems or concerns.

## 2012-08-11 NOTE — Progress Notes (Signed)
Shannon Kirby Date of Birth: 1931-10-13 Medical Record #[id number]  History of Present Illness: Mr. Shannon Kirby is seen back today for a post hospital visit. Seen for Dr. Patty Sermons. Has HTN, HLD, diabetes and atrial fib along with urinary retention. Has long standing tobacco abuse and not interested in stopping.   Has had 3 admissions in June. Started with urinary retention with foley placed. Noted to have UTI and given antibiotics. Readmitted with dehydration and noted to be in atrial fib - placed on xarelto - to be managed with rate control and anticoagulation apparently - woke up from a nap on June 17th with slurred speech, ataxia, confusion and falling over. Taken back to the hospital. Code stroke was called but not a candidate due to his Xarelto. CT showed a 4 mm hypodensity in the right thalamus likely reflecting a lacunar infarction. Echo from earlier in June showed EF of 50 to 55%. Reported some bloody stools and had colonoscopy with large rectal ulcer, internal hemorrhoids and diverticulosis noted - plan was to hold anticoagulation for 10 days and then resume his Xarelto.   Comes in today. Here with his son. Doing ok. Back on his Xarelto. Saw his PCP last week. He has no complaints. Back to his baseline from a neuro standpoint. Not short of breath. Not dizzy. No falls. Taking his medicines. No chest pain. Not interested in smoking cessation. Denies any palpitations. No awareness of his atrial fib.  Lives with his daughter. His weight has been a chronic issue.    Current Outpatient Prescriptions  Medication Sig Dispense Refill  . ALPRAZolam (XANAX) 1 MG tablet Take 0.5-1 mg by mouth 4 (four) times daily - after meals and at bedtime. Take half of a table three times a day and a whole tablet at bedtime.      Marland Kitchen diltiazem (CARDIZEM CD) 120 MG 24 hr capsule Take 1 capsule (120 mg total) by mouth daily.  30 capsule  1  . feeding supplement (ENSURE COMPLETE) LIQD Take 237 mLs by mouth 2 (two) times daily  between meals.      . finasteride (PROSCAR) 5 MG tablet Take 5 mg by mouth daily.      . metoprolol succinate (TOPROL-XL) 12.5 mg TB24 Take 0.5 tablets (12.5 mg total) by mouth daily.  30 tablet  1  . polyethylene glycol (MIRALAX / GLYCOLAX) packet Take 17 g by mouth daily.  30 each  2  . pravastatin (PRAVACHOL) 10 MG tablet Take 10 mg by mouth every evening.      . Rivaroxaban (XARELTO) 20 MG TABS Take 1 tablet (20 mg total) by mouth daily with supper. Hold it for 10 days and then start taking medication as previously prescribed.      . silodosin (RAPAFLO) 8 MG CAPS capsule Take 8 mg by mouth daily with breakfast.       No current facility-administered medications for this visit.    No Known Allergies  Past Medical History  Diagnosis Date  . Hypertension   . Hernia     L groin   . Hyperlipidemia   . Pre-diabetes   . A-fib   . Stroke   . Urinary retention   . Protein calorie malnutrition   . GI bleed June 2014    Past Surgical History  Procedure Laterality Date  . No past surgeries    . Tonsillectomy and adenoidectomy  age 79  . Colonoscopy N/A 07/13/2012    Procedure: COLONOSCOPY;  Surgeon: Shirley Friar, MD;  Location: [facility] ENDOSCOPY;  Service: Endoscopy;  Laterality: N/A;    History  Smoking status  . Current Every Day Smoker -- 1.00 packs/day for 63 years  Smokeless tobacco  . Never Used    Comment: Smoked since age 30    History  Alcohol Use No    Family History  Problem Relation Age of Onset  . Other Mother     Passed at 50 of "blood clot in chest"  . Colon cancer Father     Passed at 61  . Heart attack Sister     Passed away 18    Review of Systems: The review of systems is per the HPI.  All other systems were reviewed and are negative.  Physical Exam: BP 130/68  Pulse 80  Ht 6\' 3"  (1.905 m)  Wt 134 lb 12.8 oz (61.145 kg)  BMI 16.85 kg/m2 Patient is very pleasant and in no acute distress. He is quite thin. Skin is warm and dry. Color is  normal.  HEENT is unremarkable. Normocephalic/atraumatic. PERRL. Sclera are nonicteric. Neck is supple. No masses. No JVD. Lungs are coarse. Cardiac exam shows an irregular rhythm. Rate in the 90's by my exam. Abdomen is soft. Extremities are without edema. Gait and ROM are intact. No gross neurologic deficits noted.  LABORATORY DATA: N/A   Lab Results  Component Value Date   WBC 7.0 08/07/2012   HGB 16.1 08/07/2012   HCT 48.1 08/07/2012   PLT 151 08/07/2012   GLUCOSE 152* 08/07/2012   CHOL 133 07/09/2012   TRIG 152* 07/09/2012   HDL 42 07/09/2012   LDLCALC 61 07/09/2012   ALT 16 07/08/2012   AST 20 07/08/2012   NA 141 08/07/2012   K 4.6 08/07/2012   CL 101 08/07/2012   CREATININE 1.18 08/07/2012   BUN 18 08/07/2012   CO2 29 08/07/2012   TSH 0.967 06/30/2012   PSA 3.14 06/30/2012   INR 1.31 08/07/2012   HGBA1C 5.7* 07/09/2012     Assessment / Plan: 1. Atrial fib - managed with rate control and anticoagulation - I have left him on his current regimen. He is totally asymptomatic.   2. Recent stroke - says he is back to his baseline.   3. HTN - BP is ok.   4. BPH with urinary retention - on flomax and proscar - plan per urology - foley is now out.   5. Bloody stools with recent colonoscopy - large rectal ulcer, internal hemorrhoids and diverticulosis - no further bleeding and now back on his Xarelto.   Dr. Patty Sermons will see back in 2 to 3 months. No change in current regimen.   Patient is agreeable to this plan and will call if any problems develop in the interim.   Shannon Macadamia, RN, ANP-C [facility] [address] [place], [place]  [address]

## 2012-09-01 ENCOUNTER — Encounter: Payer: Self-pay | Admitting: Cardiology

## 2012-09-07 ENCOUNTER — Encounter (HOSPITAL_COMMUNITY): Payer: Self-pay | Admitting: Gastroenterology

## 2012-10-28 ENCOUNTER — Ambulatory Visit: Payer: No Typology Code available for payment source | Admitting: Cardiology

## 2012-10-29 ENCOUNTER — Ambulatory Visit (INDEPENDENT_AMBULATORY_CARE_PROVIDER_SITE_OTHER): Payer: No Typology Code available for payment source | Admitting: Cardiology

## 2012-10-29 ENCOUNTER — Encounter: Payer: Self-pay | Admitting: Cardiology

## 2012-10-29 VITALS — BP 132/87 | HR 87 | Ht 75.0 in | Wt 127.0 lb

## 2012-10-29 DIAGNOSIS — I4891 Unspecified atrial fibrillation: Secondary | ICD-10-CM

## 2012-10-29 DIAGNOSIS — K922 Gastrointestinal hemorrhage, unspecified: Secondary | ICD-10-CM

## 2012-10-29 DIAGNOSIS — E43 Unspecified severe protein-calorie malnutrition: Secondary | ICD-10-CM

## 2012-10-29 DIAGNOSIS — I639 Cerebral infarction, unspecified: Secondary | ICD-10-CM

## 2012-10-29 DIAGNOSIS — I6350 Cerebral infarction due to unspecified occlusion or stenosis of unspecified cerebral artery: Secondary | ICD-10-CM

## 2012-10-29 NOTE — Progress Notes (Signed)
Shannon Kirby Date of Birth:  1931/03/11 [facility] [address] [place], [place]  [address] [phone]         Fax   [phone]  History of Present Illness: This 79 year old gentleman is seen for a followup office visit.  He has a history of established permanent atrial fibrillation.  He also has hypertension hyperlipidemia diabetes and a past history of urinary retention.  He has a long history of tobacco abuse and is not interested in stopping.  He had a stroke on 07/08/12 with slurred speech, ataxia, confusion and weakness.  His echo from earlier in June had shown an ejection fraction of 50-55%.  Since then he has had no further episodes of TIA symptoms.  He is not having any chest pain or shortness of breath.  He denies any dizziness or syncope.  He has had unintentional weight loss probably related to his ongoing cigarette smoking.  Current Outpatient Prescriptions  Medication Sig Dispense Refill  . ALPRAZolam (XANAX) 1 MG tablet Take 0.5-1 mg by mouth 4 (four) times daily - after meals and at bedtime. Take half of a table three times a day and a whole tablet at bedtime.      Marland Kitchen diltiazem (CARDIZEM CD) 120 MG 24 hr capsule Take 1 capsule (120 mg total) by mouth daily.  30 capsule  1  . feeding supplement (ENSURE COMPLETE) LIQD Take 237 mLs by mouth 2 (two) times daily between meals.      . finasteride (PROSCAR) 5 MG tablet Take 5 mg by mouth daily.      . metoprolol succinate (TOPROL-XL) 12.5 mg TB24 Take 0.5 tablets (12.5 mg total) by mouth daily.  30 tablet  1  . polyethylene glycol (MIRALAX / GLYCOLAX) packet Take 17 g by mouth daily.  30 each  2  . pravastatin (PRAVACHOL) 10 MG tablet Take 10 mg by mouth every evening.      . Rivaroxaban (XARELTO) 20 MG TABS Take 1 tablet (20 mg total) by mouth daily with supper. Hold it for 10 days and then start taking medication as previously prescribed.      . silodosin (RAPAFLO) 8 MG CAPS capsule Take 8 mg by mouth  daily with breakfast.       No current facility-administered medications for this visit.    No Known Allergies  Patient Active Problem List   Diagnosis Date Noted  . GI bleed 07/13/2012  . Protein-calorie malnutrition, severe 07/09/2012  . CVA (cerebral infarction) 07/08/2012  . Orthostatic hypotension 07/02/2012  . UTI (lower urinary tract infection) 07/02/2012  . Near syncope 07/02/2012  . Acute urinary retention 06/30/2012  . Atrial fibrillation 06/30/2012  . HTN (hypertension), benign 06/30/2012  . Anxiety 06/30/2012    History  Smoking status  . Current Every Day Smoker -- 1.00 packs/day for 63 years  Smokeless tobacco  . Never Used    Comment: Smoked since age 60    History  Alcohol Use No    Family History  Problem Relation Age of Onset  . Other Mother     Passed at 26 of "blood clot in chest"  . Colon cancer Father     Passed at 49  . Heart attack Sister     Passed away 78    Review of Systems: Constitutional: no fever chills diaphoresis or fatigue or change in weight.  Head and neck: no hearing loss, no epistaxis, no photophobia or visual disturbance. Respiratory: No  cough, shortness of breath or wheezing. Cardiovascular: No chest pain peripheral edema, palpitations. Gastrointestinal: No abdominal distention, no abdominal pain, no change in bowel habits hematochezia or melena. Genitourinary: No dysuria, no frequency, no urgency, no nocturia. Musculoskeletal:No arthralgias, no back pain, no gait disturbance or myalgias. Neurological: No dizziness, no headaches, no numbness, no seizures, no syncope, no weakness, no tremors. Hematologic: No lymphadenopathy, no easy bruising. Psychiatric: No confusion, no hallucinations, no sleep disturbance.    Physical Exam: Filed Vitals:   10/29/12 1440  BP: 132/87  Pulse: 87   the general appearance reveals an elderly thin gentleman in no acute distress.The head and neck exam reveals pupils equal and reactive.   Extraocular movements are full.  There is no scleral icterus.  The mouth and pharynx are normal.  The neck is supple.  The carotids reveal no bruits.  The jugular venous pressure is normal.  The  thyroid is not enlarged.  There is no lymphadenopathy.  The chest is clear to percussion and auscultation.  There are no rales or rhonchi.  Expansion of the chest is symmetrical.  Breath sounds are distant consistent with COPD. The precordium is quiet.  The pulse is irregularly irregular  The first heart sound is normal.  The second heart sound is physiologically split.  There is no murmur gallop rub or click.  There is no abnormal lift or heave.  The abdomen is soft and nontender.  The bowel sounds are normal.  The liver and spleen are not enlarged.  There are no abdominal masses.  There are no abdominal bruits.  Extremities reveal good pedal pulses.  There is no phlebitis or edema.  There is no cyanosis or clubbing.  Strength is normal and symmetrical in all extremities.  There is no lateralizing weakness.  There are no sensory deficits.  The skin is warm and dry.  There is no rash.    Assessment / Plan: The patient is to continue same medication.  Advised to stop smoking and we want him to be more and gain weight.  Recheck in 4 months for office visit and EKG

## 2012-10-29 NOTE — Assessment & Plan Note (Signed)
The patient's family states that he eats adequately.  However the patient has lost 7 more pounds since he was last seen in our office in July.  I have encouraged him to cut back on his smoking and hopefully to quit altogether and that will probably unable him to put on some weight.

## 2012-10-29 NOTE — Assessment & Plan Note (Signed)
Patient is in chronic permanent atrial fibrillation.  Ventricular response is satisfactory on diltiazem and Toprol.  He has had no further TIA symptoms

## 2012-10-29 NOTE — Patient Instructions (Signed)
Eat more  Your physician recommends that you continue on your current medications as directed. Please refer to the Current Medication list given to you today.  Your physician wants you to follow-up in: 4 month ov/ekg You will receive a reminder letter in the mail two months in advance. If you don't receive a letter, please call our office to schedule the follow-up appointment.

## 2012-10-29 NOTE — Assessment & Plan Note (Signed)
The patient remains on Xarelto.  He is not having any further evidence of any GI bleeding or diverticular bleed

## 2013-01-02 ENCOUNTER — Telehealth: Payer: Self-pay | Admitting: Neurology

## 2013-01-02 NOTE — Telephone Encounter (Signed)
Patient's daughter called stating that patient had stroke in June and just came across the paper that says to come back in for a 2 month follow up with Dr Anne Hahn. Patient wants to schedule this follow up now, but per Dr Anne Hahn' schedule, nothing until June. Please call to schedule, patient's daughter prefers to only be called in the morning.

## 2013-01-02 NOTE — Telephone Encounter (Signed)
This patient was seen in June of 2014 following a stroke. He was asked to followup in 2 months, but apparently never did. I will try to get him work in sometime within the next 3 or 4 weeks.

## 2013-01-02 NOTE — Telephone Encounter (Signed)
Please advise 

## 2013-01-04 ENCOUNTER — Emergency Department (HOSPITAL_COMMUNITY): Payer: No Typology Code available for payment source

## 2013-01-04 ENCOUNTER — Emergency Department (HOSPITAL_COMMUNITY)
Admission: EM | Admit: 2013-01-04 | Discharge: 2013-01-04 | Disposition: A | Discharge status: Home / Self Care | Payer: No Typology Code available for payment source | Attending: Emergency Medicine | Admitting: Emergency Medicine

## 2013-01-04 ENCOUNTER — Encounter (HOSPITAL_COMMUNITY): Payer: Self-pay | Admitting: Emergency Medicine

## 2013-01-04 DIAGNOSIS — Z7901 Long term (current) use of anticoagulants: Secondary | ICD-10-CM | POA: Insufficient documentation

## 2013-01-04 DIAGNOSIS — Z79899 Other long term (current) drug therapy: Secondary | ICD-10-CM | POA: Insufficient documentation

## 2013-01-04 DIAGNOSIS — R34 Anuria and oliguria: Secondary | ICD-10-CM | POA: Insufficient documentation

## 2013-01-04 DIAGNOSIS — R3915 Urgency of urination: Secondary | ICD-10-CM | POA: Insufficient documentation

## 2013-01-04 DIAGNOSIS — R350 Frequency of micturition: Secondary | ICD-10-CM | POA: Insufficient documentation

## 2013-01-04 DIAGNOSIS — F17200 Nicotine dependence, unspecified, uncomplicated: Secondary | ICD-10-CM | POA: Insufficient documentation

## 2013-01-04 DIAGNOSIS — Z8673 Personal history of transient ischemic attack (TIA), and cerebral infarction without residual deficits: Secondary | ICD-10-CM | POA: Insufficient documentation

## 2013-01-04 DIAGNOSIS — I10 Essential (primary) hypertension: Secondary | ICD-10-CM | POA: Insufficient documentation

## 2013-01-04 DIAGNOSIS — E785 Hyperlipidemia, unspecified: Secondary | ICD-10-CM | POA: Insufficient documentation

## 2013-01-04 DIAGNOSIS — I4891 Unspecified atrial fibrillation: Secondary | ICD-10-CM | POA: Insufficient documentation

## 2013-01-04 LAB — CBC WITH DIFFERENTIAL/PLATELET
Basophils Relative: 1 % (ref 0–1)
Eosinophils Absolute: 0.1 10*3/uL (ref 0.0–0.7)
Hemoglobin: 15.1 g/dL (ref 13.0–17.0)
MCHC: 34.6 g/dL (ref 30.0–36.0)
Monocytes Relative: 7 % (ref 3–12)
Neutro Abs: 5.9 10*3/uL (ref 1.7–7.7)
Neutrophils Relative %: 78 % — ABNORMAL HIGH (ref 43–77)
Platelets: 170 10*3/uL (ref 150–400)
RBC: 4.51 MIL/uL (ref 4.22–5.81)

## 2013-01-04 LAB — BASIC METABOLIC PANEL
BUN: 21 mg/dL (ref 6–23)
Chloride: 98 mEq/L (ref 96–112)
GFR calc Af Amer: 66 mL/min — ABNORMAL LOW (ref >90)
GFR calc non Af Amer: 57 mL/min — ABNORMAL LOW (ref >90)
Potassium: 6 mEq/L — ABNORMAL HIGH (ref 3.5–5.1)
Sodium: 136 mEq/L (ref 135–145)

## 2013-01-04 LAB — URINALYSIS, ROUTINE W REFLEX MICROSCOPIC
Bilirubin Urine: NEGATIVE
Glucose, UA: 100 mg/dL — AB
Ketones, ur: NEGATIVE mg/dL
Specific Gravity, Urine: 1.031 — ABNORMAL HIGH (ref 1.005–1.030)
pH: 5.5 (ref 5.0–8.0)

## 2013-01-04 LAB — URINE MICROSCOPIC-ADD ON

## 2013-01-04 LAB — POTASSIUM: Potassium: 4.8 mEq/L (ref 3.5–5.1)

## 2013-01-04 NOTE — ED Notes (Signed)
His daughter phoned EMS r/t pt. Earlier today was found to be profoundly confused; and was found to be attempting to urinate in a hallway.  As I write this pt. Is alert with clear speech.  He is able to tell me it is Sunday.  He tells me it is the month of August; then he announces he will be spending Thanksgiving with his family next week.  His skin is pale warm and dry and he is breathing normally.  He steadfastly denies any pain or discomfort.  He is able to capably ambulate a short distance to his stretcher.

## 2013-01-04 NOTE — ED Notes (Signed)
Daughter told EMS pt was fine when she went to work this morning, returned around noon, pt had  behavior changes, stumbled and tried to [place] in floor. Concerned about behavior, pt has been oriented for EMS

## 2013-01-04 NOTE — ED Notes (Signed)
Pt transported to XRAY °

## 2013-01-04 NOTE — ED Provider Notes (Signed)
CSN: [id number]     Arrival date & time 01/04/13  1754 History   First MD Initiated Contact with Patient 01/04/13 1801     Chief Complaint  Patient presents with  . Altered Mental Status    Resolved   (Consider location/radiation/quality/duration/timing/severity/associated sxs/prior Treatment) Patient is a 79 y.o. male presenting with altered mental status.  Altered Mental Status Presenting symptoms: behavior changes and confusion   Severity:  Mild Most recent episode: past several days. Episode history:  Multiple Timing:  Intermittent Progression:  Worsening Chronicity:  New Context comment:  Family is worried that he is developing signs of dementia. Associated symptoms: no abdominal pain, no fever, no nausea and no vomiting   Associated symptoms comment:  Urinary frequency, urinary urgency, voiding small amounts.     Past Medical History  Diagnosis Date  . Hypertension   . Hernia     L groin   . Hyperlipidemia   . Pre-diabetes   . A-fib   . Stroke   . Urinary retention   . Protein calorie malnutrition   . GI bleed June 2014   Past Surgical History  Procedure Laterality Date  . No past surgeries    . Tonsillectomy and adenoidectomy  age 79  . Colonoscopy N/A 07/13/2012    Procedure: COLONOSCOPY;  Surgeon: Shirley Friar, MD;  Location: [facility] ENDOSCOPY;  Service: Endoscopy;  Laterality: N/A;   Family History  Problem Relation Age of Onset  . Other Mother     Passed at 37 of "blood clot in chest"  . Colon cancer Father     Passed at 26  . Heart attack Sister     Passed away 67   History  Substance Use Topics  . Smoking status: Current Every Day Smoker -- 1.00 packs/day for 63 years  . Smokeless tobacco: Never Used     Comment: Smoked since age 25  . Alcohol Use: No    Review of Systems  Constitutional: Negative for fever.  HENT: Negative for congestion.   Respiratory: Negative for cough and shortness of breath.   Cardiovascular: Negative for chest  pain.  Gastrointestinal: Negative for nausea, vomiting, abdominal pain and diarrhea.  Psychiatric/Behavioral: Positive for confusion.  All other systems reviewed and are negative.    Allergies  Review of patient's allergies indicates no known allergies.  Home Medications   Current Outpatient Rx  Name  Route  Sig  Dispense  Refill  . ALPRAZolam (XANAX) 1 MG tablet   Oral   Take 0.5-1 mg by mouth 4 (four) times daily - after meals and at bedtime. Take half of a table three times a day and a whole tablet at bedtime.         Marland Kitchen diltiazem (CARDIZEM CD) 120 MG 24 hr capsule   Oral   Take 1 capsule (120 mg total) by mouth daily.   30 capsule   1   . finasteride (PROSCAR) 5 MG tablet   Oral   Take 5 mg by mouth daily.         . pravastatin (PRAVACHOL) 10 MG tablet   Oral   Take 10 mg by mouth every evening.         . Rivaroxaban (XARELTO) 20 MG TABS tablet   Oral   Take 20 mg by mouth daily with supper.         . silodosin (RAPAFLO) 8 MG CAPS capsule   Oral   Take 8 mg by mouth daily with breakfast.         .  feeding supplement (ENSURE COMPLETE) LIQD   Oral   Take 237 mLs by mouth 2 (two) times daily between meals.         . polyethylene glycol (MIRALAX / GLYCOLAX) packet   Oral   Take 17 g by mouth daily.   30 each   2    BP 124/84  Pulse 94  Temp(Src) 98.8 F (37.1 C) (Oral)  Resp 18  SpO2 98% Physical Exam  Nursing note and vitals reviewed. Constitutional: He is oriented to person, place, and time. He appears well-developed and well-nourished. No distress.  HENT:  Head: Normocephalic and atraumatic.  Mouth/Throat: Oropharynx is clear and moist.  Eyes: Conjunctivae are normal. Pupils are equal, round, and reactive to light. No scleral icterus.  Neck: Neck supple.  Cardiovascular: Normal rate, regular rhythm, normal heart sounds and intact distal pulses.   No murmur heard. Pulmonary/Chest: Effort normal and breath sounds normal. No stridor. No  respiratory distress. He has no wheezes. He has no rales.  Abdominal: Soft. He exhibits no distension. There is no tenderness.  Musculoskeletal: Normal range of motion. He exhibits no edema.  Neurological: He is alert and oriented to person, place, and time. He has normal strength. No cranial nerve deficit or sensory deficit. Coordination and gait normal. GCS eye subscore is 4. GCS verbal subscore is 5. GCS motor subscore is 6.  Reflex Scores:      Patellar reflexes are 2+ on the right side and 2+ on the left side. Skin: Skin is warm and dry. No rash noted.  Psychiatric: He has a normal mood and affect. His behavior is normal.    ED Course  Procedures (including critical care time) Labs Review Labs Reviewed  URINALYSIS, ROUTINE W REFLEX MICROSCOPIC - Abnormal; Notable for the following:    Specific Gravity, Urine 1.031 (*)    Glucose, UA 100 (*)    Protein, ur 30 (*)    All other components within normal limits  GLUCOSE, CAPILLARY - Abnormal; Notable for the following:    Glucose-Capillary 137 (*)    All other components within normal limits  CBC WITH DIFFERENTIAL - Abnormal; Notable for the following:    Neutrophils Relative % 78 (*)    All other components within normal limits  BASIC METABOLIC PANEL - Abnormal; Notable for the following:    Potassium 6.0 (*)    Glucose, Bld 110 (*)    GFR calc non Af Amer 57 (*)    GFR calc Af Amer 66 (*)    All other components within normal limits  URINE MICROSCOPIC-ADD ON  POTASSIUM   Imaging Review Dg Chest 2 View  01/04/2013   CLINICAL DATA:  Altered mental status, cough, history hypertension, smoking, atrial fibrillation  EXAM: CHEST  2 VIEW  COMPARISON:  07/03/2012  FINDINGS: Normal heart size, mediastinal contours, and pulmonary vascularity.  Emphysematous and bronchitic changes consistent with COPD.  Minimal linear scarring at medial right lung base.  Lungs otherwise clear.  No pleural effusion or pneumothorax.  No acute osseous  findings.  IMPRESSION: COPD changes with linear scarring at medial right lung base.  No acute abnormalities.   Electronically Signed   By: Ulyses Southward M.D.   On: 01/04/2013 20:10   Ct Head Wo Contrast  01/04/2013   CLINICAL DATA:  Altered mental status.  Confusion.  EXAM: CT HEAD WITHOUT CONTRAST  TECHNIQUE: Contiguous axial images were obtained from the base of the skull through the vertex without intravenous contrast.  COMPARISON:  08/07/2012.  FINDINGS: No mass lesion, mass effect, midline shift, hydrocephalus, hemorrhage. No acute territorial cortical ischemia/infarct. Atrophy and chronic ischemic white matter disease is present. Intracranial atherosclerosis.  IMPRESSION: Atrophy and chronic ischemic white matter disease without acute intracranial abnormality.   Electronically Signed   By: Andreas Newport M.D.   On: 01/04/2013 20:48  All radiology studies independently viewed by me.     EKG Interpretation    Date/Time:  Sunday January 04 2013 19:37:15 EST Ventricular Rate:  80 PR Interval:    QRS Duration: 146 QT Interval:  393 QTC Calculation: 453 R Axis:   58 Text Interpretation:  Atrial fibrillation RBBB No significant change was found Confirmed by [facility]  MD, TREY ([id number]) on 01/04/2013 8:18:57 PM            MDM   1. Urinary frequency    79 yo male presenting with intermittent but worsening confusion and behavioral changes.  His daughter reports that he has been [place] and has spoken to her angrily today, which is atypical for him.  She also reports some recent worsening urinary symptoms.  Pt himself denies any symptoms, is alert and oriented.    His UA was not revealing.  Therefore, will proceed with broader workup to attempt to determine cause of AMS.    Workup unrevealing.  He remained well appearing during ED course with appropriate mental status.  He had a small amount of urinary retention, which is likely chronic, and for which he follows with a Insurance underwriter.  I feel he  is appropriate for discharge home with his family.  Advised PCP and Urology follow up.    Candyce Churn, MD 01/04/13 [phone]

## 2013-01-04 NOTE — ED Notes (Signed)
CBG registered 137 on ED Glucometer.

## 2013-01-04 NOTE — ED Notes (Signed)
Pt aware of the need for a urine sample. Urinal at bedside. 

## 2013-01-05 ENCOUNTER — Telehealth: Payer: Self-pay | Admitting: *Deleted

## 2013-01-05 NOTE — Telephone Encounter (Signed)
I have tried multiple times today to contact the patient to schedule an office visit. The phone has given a busy signal all day. I will try to continue through out the week to contact him.

## 2013-01-06 ENCOUNTER — Telehealth: Payer: Self-pay | Admitting: *Deleted

## 2013-01-06 NOTE — Telephone Encounter (Signed)
Spoke with family friend who was sitting with Shannon Kirby. Left msg to have [name] call me back to schedule office visit.

## 2013-01-07 ENCOUNTER — Telehealth: Payer: Self-pay | Admitting: Neurology

## 2013-01-07 NOTE — Telephone Encounter (Signed)
Patient's wife returning your call, please call the patient back about scheduling an appointment. Please call.

## 2013-01-08 ENCOUNTER — Encounter: Payer: Self-pay | Admitting: *Deleted

## 2013-01-09 ENCOUNTER — Encounter: Payer: Self-pay | Admitting: Cardiology

## 2013-01-09 ENCOUNTER — Ambulatory Visit (INDEPENDENT_AMBULATORY_CARE_PROVIDER_SITE_OTHER): Payer: No Typology Code available for payment source | Admitting: Cardiology

## 2013-01-09 VITALS — BP 107/70 | HR 105 | Ht 75.0 in | Wt 125.0 lb

## 2013-01-09 DIAGNOSIS — I4891 Unspecified atrial fibrillation: Secondary | ICD-10-CM

## 2013-01-09 DIAGNOSIS — I951 Orthostatic hypotension: Secondary | ICD-10-CM

## 2013-01-09 DIAGNOSIS — E43 Unspecified severe protein-calorie malnutrition: Secondary | ICD-10-CM

## 2013-01-09 DIAGNOSIS — R338 Other retention of urine: Secondary | ICD-10-CM

## 2013-01-09 NOTE — Patient Instructions (Addendum)
OK TO START MYRBETRIQ   STOP METOPROLOL   Liberalize salt intake  Drink plenty of water  Keep your February appointment

## 2013-01-09 NOTE — Assessment & Plan Note (Signed)
The patient still demonstrates orthostatic hypotension on exam today although he denies dizziness today when he stands up.  His blood pressure drops to 84/60 upon standing.  According to the family the patient does not drink much fluids.  We have told him to liberalize his dietary salt intake as well as drinking plenty of fluids.  We will continue to hold his Toprol.

## 2013-01-09 NOTE — Progress Notes (Signed)
Shannon Kirby Date of Birth:  11-25-1931 [address] [place], [place]  [address] [phone]         Fax   [phone]  History of Present Illness: This 79 year old gentleman is seen for a work in office visit.  He is referred over from Dr. Caryn Bee Little's office for evaluation.  2 days ago the patient was at his urology office and after he got up off the exam table after having a bladder scan he stood up and then blacked out.  Since then his metoprolol has been placed on hold.  He has a history of established permanent atrial fibrillation.  He also has hypertension hyperlipidemia diabetes and a past history of urinary retention.  He has a long history of tobacco abuse and is not interested in stopping.  He had a stroke on 07/08/12 with slurred speech, ataxia, confusion and weakness.  His echo from earlier in June had shown an ejection fraction of 50-55%.  Since then he has had no further episodes of TIA symptoms.  He is not having any chest pain or shortness of breath.    He has had unintentional weight loss probably related to his ongoing cigarette smoking.  Current Outpatient Prescriptions  Medication Sig Dispense Refill  . ALPRAZolam (XANAX) 1 MG tablet Take 0.5-1 mg by mouth 4 (four) times daily -  with meals and at bedtime. Take half of a table three times a day and a whole tablet at bedtime.      Marland Kitchen diltiazem (CARDIZEM CD) 120 MG 24 hr capsule Take 1 capsule (120 mg total) by mouth daily.  30 capsule  1  . escitalopram (LEXAPRO) 10 MG tablet Take 10 mg by mouth daily at 12 noon.      . feeding supplement (ENSURE COMPLETE) LIQD Take 237 mLs by mouth 2 (two) times daily between meals.      . finasteride (PROSCAR) 5 MG tablet Take 5 mg by mouth daily at 12 noon.       . mirabegron ER (MYRBETRIQ) 25 MG TB24 tablet Take 25 mg by mouth daily.      . polyethylene glycol (MIRALAX / GLYCOLAX) packet Take 17 g by mouth daily.  30 each  2  . pravastatin (PRAVACHOL) 10 MG tablet Take  10 mg by mouth every evening.      . Rivaroxaban (XARELTO) 20 MG TABS tablet Take 20 mg by mouth daily at 12 noon.       . silodosin (RAPAFLO) 8 MG CAPS capsule Take 8 mg by mouth daily with breakfast.       No current facility-administered medications for this visit.    No Known Allergies  Patient Active Problem List   Diagnosis Date Noted  . GI bleed 07/13/2012  . Protein-calorie malnutrition, severe 07/09/2012  . CVA (cerebral infarction) 07/08/2012  . Orthostatic hypotension 07/02/2012  . UTI (lower urinary tract infection) 07/02/2012  . Near syncope 07/02/2012  . Acute urinary retention 06/30/2012  . Atrial fibrillation 06/30/2012  . HTN (hypertension), benign 06/30/2012  . Anxiety 06/30/2012    History  Smoking status  . Current Every Day Smoker -- 1.00 packs/day for 63 years  Smokeless tobacco  . Never Used    Comment: Smoked since age 89    History  Alcohol Use No    Family History  Problem Relation Age of Onset  . Other Mother     Passed at 63 of "blood clot in chest"  .  Colon cancer Father     Passed at 101  . Heart attack Sister     Passed away 77    Review of Systems: Constitutional: no fever chills diaphoresis or fatigue or change in weight.  Head and neck: no hearing loss, no epistaxis, no photophobia or visual disturbance. Respiratory: No cough, shortness of breath or wheezing. Cardiovascular: No chest pain peripheral edema, palpitations. Gastrointestinal: No abdominal distention, no abdominal pain, no change in bowel habits hematochezia or melena. Genitourinary: No dysuria, no frequency, no urgency, no nocturia. Musculoskeletal:No arthralgias, no back pain, no gait disturbance or myalgias. Neurological: No dizziness, no headaches, no numbness, no seizures, no syncope, no weakness, no tremors. Hematologic: No lymphadenopathy, no easy bruising. Psychiatric: No confusion, no hallucinations, no sleep disturbance.    Physical Exam: Filed Vitals:     01/09/13 1108  BP: 107/70  Pulse: 105   the general appearance reveals an elderly thin gentleman in no acute distress.The head and neck exam reveals pupils equal and reactive.  Extraocular movements are full.  There is no scleral icterus.  The mouth and pharynx are normal.  The neck is supple.  The carotids reveal no bruits.  The jugular venous pressure is normal.  The  thyroid is not enlarged.  There is no lymphadenopathy.  The chest is clear to percussion and auscultation.  There are no rales or rhonchi.  Expansion of the chest is symmetrical.  Breath sounds are distant consistent with COPD. The precordium is quiet.  The pulse is irregularly irregular  The first heart sound is normal.  The second heart sound is physiologically split.  There is no murmur gallop rub or click.  There is no abnormal lift or heave.  The abdomen is soft and nontender.  The bowel sounds are normal.  The liver and spleen are not enlarged.  There are no abdominal masses.  There are no abdominal bruits.  Extremities reveal good pedal pulses.  There is no phlebitis or edema.  There is no cyanosis or clubbing.  Strength is normal and symmetrical in all extremities.  There is no lateralizing weakness.  There are no sensory deficits.  The skin is warm and dry.  There is no rash.  EKG shows atrial fibrillation with controlled ventricular response and right bundle branch block.  Since previous tracing of 07/21/12, heart rate is slower  Assessment / Plan: Continue off metoprolol.  Increase dietary salt intake.  Drink plenty of water and fluids.  Keep appointment in February as scheduled and we will repeat an office visit and EKG then

## 2013-01-09 NOTE — Assessment & Plan Note (Signed)
The patient has a problem with urinary retention.  His insurance will no longer pay for rapaflo so we will be switching to generic Flomax syndrome.  His urologist has also given him Myrbetriq which if anything should help support his blood pressure

## 2013-01-09 NOTE — Assessment & Plan Note (Addendum)
EKG today shows atrial fibrillation with a ventricular response of 105.  He has not taken his metoprolol for 2 days.  He remains on diltiazem for rate control at a dose of 120 mg daily.  He is tolerating his present heart rate without difficulty.  He will remain on Xarelto for anticoagulation

## 2013-01-27 ENCOUNTER — Telehealth: Payer: Self-pay | Admitting: Neurology

## 2013-01-28 NOTE — Telephone Encounter (Signed)
Called to offer an appointment. The wife was not home but the neighbor who sits with the patient is going to call back. I offered appointment on 01/29/13 @ 11.

## 2013-01-28 NOTE — Telephone Encounter (Signed)
Shannon Kirby can you call back patient's wife to reschedule patients appointment.

## 2013-01-29 ENCOUNTER — Ambulatory Visit (INDEPENDENT_AMBULATORY_CARE_PROVIDER_SITE_OTHER): Payer: Medicare HMO | Admitting: Neurology

## 2013-01-29 ENCOUNTER — Encounter: Payer: Self-pay | Admitting: Neurology

## 2013-01-29 VITALS — BP 136/90 | HR 45 | Ht 75.0 in | Wt 128.0 lb

## 2013-01-29 DIAGNOSIS — I639 Cerebral infarction, unspecified: Secondary | ICD-10-CM

## 2013-01-29 DIAGNOSIS — I6350 Cerebral infarction due to unspecified occlusion or stenosis of unspecified cerebral artery: Secondary | ICD-10-CM

## 2013-01-29 NOTE — Patient Instructions (Signed)
Stroke Prevention Some medical conditions and behaviors are associated with an increased chance of having a stroke. You may prevent a stroke by making healthy choices and managing medical conditions. Reduce your risk of having a stroke by:  Staying physically active. Get at least 30 minutes of activity on most or all days.  Not smoking. It may also be helpful to avoid exposure to secondhand smoke.  Limiting alcohol use. Moderate alcohol use is considered to be:  No more than 2 drinks per day for men.  No more than 1 drink per day for nonpregnant women.  Eating healthy foods.  Include 5 or more servings of fruits and vegetables a day.  Certain diets may be prescribed to address high blood pressure, high cholesterol, diabetes, or obesity.  Managing your cholesterol levels.  A low-saturated fat, low-trans fat, low-cholesterol, and high-fiber diet may control cholesterol levels.  Take any prescribed medicines to control cholesterol as directed by your caregiver.  Managing your diabetes.  A controlled-carbohydrate, controlled-sugar diet is recommended to manage diabetes.  Take any prescribed medicines to control diabetes as directed by your caregiver.  Controlling your high blood pressure (hypertension).  A low-salt (sodium), low-saturated fat, low-trans fat, and low-cholesterol diet is recommended to manage high blood pressure.  Take any prescribed medicines to control hypertension as directed by your caregiver.  Maintaining a healthy weight.  A reduced-calorie, low-sodium, low-saturated fat, low-trans fat, low-cholesterol diet is recommended to manage weight.  Stopping drug abuse.  Avoiding birth control pills.  Talk to your caregiver about the risks of taking birth control pills if you are over 35 years old, smoke, get migraines, or have ever had a blood clot.  Getting evaluated for sleep disorders (sleep apnea).  Talk to your caregiver about getting a sleep evaluation  if you snore a lot or have excessive sleepiness.  Taking medicines as directed by your caregiver.  For some people, aspirin or blood thinners (anticoagulants) are helpful in reducing the risk of forming abnormal blood clots that can lead to stroke. If you have the irregular heart rhythm of atrial fibrillation, you should be on a blood thinner unless there is a good reason you cannot take them.  Understand all your medicine instructions. SEEK IMMEDIATE MEDICAL CARE IF:   You have sudden weakness or numbness of the face, arm, or leg, especially on one side of the body.  You have sudden confusion.  You have trouble speaking (aphasia) or understanding.  You have sudden trouble seeing in one or both eyes.  You have sudden trouble walking.  You have dizziness.  You have a loss of balance or coordination.  You have a sudden, severe headache with no known cause.  You have new chest pain or an irregular heartbeat. Any of these symptoms may represent a serious problem that is an emergency. Do not wait to see if the symptoms will go away. Get medical help right away. Call your local emergency services (911 in [place] Do not drive yourself to the hospital. Document Released: 02/16/2004 Document Revised: 04/02/2011 Document Reviewed: 07/11/2012 [employer] Patient Information 2014 [employer].  

## 2013-01-29 NOTE — Progress Notes (Signed)
Reason for visit: Stroke  Shannon Kirby is an 80 y.o. male  History of present illness:  Shannon. Kirby is a 80 year old right-handed white male with a history of tobacco abuse, hypertension, dyslipidemia, atrial fibrillation, and prediabetes. The patient was admitted to the hospital on 07/08/2012. The patient presented with some slurring of speech, slight confusion. The patient was not felt to be a TPA candidate secondary to rapid clearing of deficits. MRI evaluation at that time showed a small left pontomedullary infarct consistent with a small vessel stroke. MRA of the head showed diffuse atherosclerotic changes intracranially, particularly affecting the posterior circulation. Carotid Doppler studies were unremarkable, and a 2-D echocardiogram was done showing an ejection fraction of 50-55%, otherwise unremarkable. The patient was on Xarelto at the time of the stroke, and this was continued. The patient did have a brief GI bleed that was not life-threatening. The patient has done well since the hospitalization, and he indicates that he has no residual from the stroke. The patient continues to smoke about pack a day of cigarettes daily. The patient denies any falls, or problems with numbness or weakness of the extremities. The patient denies any visual changes, double vision, speech changes or problems swallowing. The patient is having some problems with benign prostate enlargement, and he is being followed by a urologist for this. The patient returns for an evaluation.  Past Medical History  Diagnosis Date  . Hypertension   . Hernia     L groin   . Hyperlipidemia   . Pre-diabetes   . A-fib   . Stroke   . Urinary retention   . Protein calorie malnutrition   . GI bleed June 2014  . BPH (benign prostatic hyperplasia)     Past Surgical History  Procedure Laterality Date  . No past surgeries    . Tonsillectomy and adenoidectomy  age 22  . Colonoscopy N/A 07/13/2012    Procedure: COLONOSCOPY;   Surgeon: Lear Ng, Kirby;  Location: [facility] ENDOSCOPY;  Service: Endoscopy;  Laterality: N/A;    Family History  Problem Relation Age of Onset  . Other Mother     Passed at 48 of "blood clot in chest"  . Colon cancer Father     Passed at 107  . Heart attack Sister     Passed away 3  . Dementia Sister     Social history:  reports that he has been smoking.  He has never used smokeless tobacco. He reports that he does not drink alcohol or use illicit drugs.   No Known Allergies  Medications:  Current Outpatient Prescriptions on File Prior to Visit  Medication Sig Dispense Refill  . ALPRAZolam (XANAX) 1 MG tablet Take 0.5-1 mg by mouth 4 (four) times daily -  with meals and at bedtime. Take half of a table three times a day and a whole tablet at bedtime.      Marland Kitchen diltiazem (CARDIZEM CD) 120 MG 24 hr capsule Take 1 capsule (120 mg total) by mouth daily.  30 capsule  1  . escitalopram (LEXAPRO) 10 MG tablet Take 10 mg by mouth daily at 12 noon.      . feeding supplement (ENSURE COMPLETE) LIQD Take 237 mLs by mouth 2 (two) times daily between meals.      . finasteride (PROSCAR) 5 MG tablet Take 5 mg by mouth daily at 12 noon.       . mirabegron ER (MYRBETRIQ) 25 MG TB24 tablet Take 25 mg by  mouth daily.      . polyethylene glycol (MIRALAX / GLYCOLAX) packet Take 17 g by mouth daily.  30 each  2  . pravastatin (PRAVACHOL) 10 MG tablet Take 10 mg by mouth every evening.      . Rivaroxaban (XARELTO) 20 MG TABS tablet Take 20 mg by mouth daily at 12 noon.       . silodosin (RAPAFLO) 8 MG CAPS capsule Take 8 mg by mouth daily with breakfast.       No current facility-administered medications on file prior to visit.    ROS:  Out of a complete 14 system review of symptoms, the patient complains only of the following symptoms, and all other reviewed systems are negative.  Depression, anxiety Difficulty voiding the bladder  Blood pressure 136/90, pulse 45, height 6\' 3"  (1.905 m), weight  128 lb (58.06 kg).  Physical Exam  General: The patient is alert and cooperative at the time of the examination. The patient is thin.  Skin: No significant peripheral edema is noted.   Neurologic Exam  Mental status: The patient is oriented x 3.  Cranial nerves: Facial symmetry is present. Speech is normal, no aphasia or dysarthria is noted. Extraocular movements are full. Visual fields are full. Tongue is midline.  Motor: The patient has good strength in all 4 extremities.  Sensory examination: Soft touch sensation on the face, arms, and legs is symmetric.  Coordination: The patient has good finger-nose-finger and heel-to-shin bilaterally.  Gait and station: The patient has a normal gait. Tandem gait is slightly unsteady. Romberg is negative. No drift is seen.  Reflexes: Deep tendon reflexes are symmetric.   MRI brain 07/09/2012:  IMPRESSION:  Motion degraded exam.  Tiny acute left pontomedullary junction infarct suspected.     MRA head 07/09/2012:  IMPRESSION:  Significant intracranial atherosclerotic type changes more notable  involving the posterior circulation as discussed above the    Assessment/Plan:  1. Cerebrovascular disease, left pontomedullary stroke  2. Ongoing tobacco abuse  3. Atrial fibrillation  I have instructed the patient that he needs to quit smoking, and I have given him information regarding reducing the risk for stroke recurrence. The patient will continue the Xarelto at this point. Clinically, he has done quite well following the stroke without residual deficit. The patient will followup through this office if needed.  Shannon Kirby 01/29/2013 7:11 PM  [facility] [address] [place], [place] [address]  Phone [phone] Fax [phone]

## 2013-03-03 ENCOUNTER — Ambulatory Visit: Payer: No Typology Code available for payment source | Admitting: Cardiology

## 2013-03-11 ENCOUNTER — Ambulatory Visit: Payer: No Typology Code available for payment source | Admitting: Cardiology

## 2013-05-05 ENCOUNTER — Encounter: Payer: Self-pay | Admitting: Nurse Practitioner

## 2013-05-05 ENCOUNTER — Ambulatory Visit (INDEPENDENT_AMBULATORY_CARE_PROVIDER_SITE_OTHER): Payer: Commercial Managed Care - HMO | Admitting: Nurse Practitioner

## 2013-05-05 VITALS — BP 110/60 | HR 89 | Ht 75.0 in | Wt 129.8 lb

## 2013-05-05 DIAGNOSIS — E785 Hyperlipidemia, unspecified: Secondary | ICD-10-CM

## 2013-05-05 DIAGNOSIS — I4891 Unspecified atrial fibrillation: Secondary | ICD-10-CM

## 2013-05-05 DIAGNOSIS — I6350 Cerebral infarction due to unspecified occlusion or stenosis of unspecified cerebral artery: Secondary | ICD-10-CM

## 2013-05-05 DIAGNOSIS — I639 Cerebral infarction, unspecified: Secondary | ICD-10-CM

## 2013-05-05 LAB — CBC
HCT: 44.2 % (ref 39.0–52.0)
Hemoglobin: 15.1 g/dL (ref 13.0–17.0)
MCHC: 34.1 g/dL (ref 30.0–36.0)
MCV: 99.3 fl (ref 78.0–100.0)
Platelets: 126 10*3/uL — ABNORMAL LOW (ref 150.0–400.0)
RBC: 4.45 Mil/uL (ref 4.22–5.81)
RDW: 13.1 % (ref 11.5–14.6)
WBC: 6.2 10*3/uL (ref 4.5–10.5)

## 2013-05-05 LAB — HEPATIC FUNCTION PANEL
ALT: 24 U/L (ref 0–53)
AST: 22 U/L (ref 0–37)
Albumin: 3.9 g/dL (ref 3.5–5.2)
Alkaline Phosphatase: 61 U/L (ref 39–117)
Bilirubin, Direct: 0.1 mg/dL (ref 0.0–0.3)
Total Bilirubin: 0.4 mg/dL (ref 0.3–1.2)
Total Protein: 6.5 g/dL (ref 6.0–8.3)

## 2013-05-05 LAB — BASIC METABOLIC PANEL
BUN: 21 mg/dL (ref 6–23)
CO2: 30 mEq/L (ref 19–32)
Calcium: 9.4 mg/dL (ref 8.4–10.5)
Chloride: 103 mEq/L (ref 96–112)
Creatinine, Ser: 1.3 mg/dL (ref 0.4–1.5)
GFR: 58.52 mL/min — ABNORMAL LOW (ref >60.00)
Glucose, Bld: 183 mg/dL — ABNORMAL HIGH (ref 70–99)
Potassium: 4.9 mEq/L (ref 3.5–5.1)
Sodium: 141 mEq/L (ref 135–145)

## 2013-05-05 LAB — LIPID PANEL
Cholesterol: 118 mg/dL (ref 0–200)
HDL: 50 mg/dL (ref >39.00)
LDL Cholesterol: 36 mg/dL (ref 0–99)
Total CHOL/HDL Ratio: 2
Triglycerides: 158 mg/dL — ABNORMAL HIGH (ref 0.0–149.0)
VLDL: 31.6 mg/dL (ref 0.0–40.0)

## 2013-05-05 NOTE — Progress Notes (Signed)
Shannon Kirby Date of Birth: 1931/09/02 Medical Record #[id number]  History of Present Illness: Shannon Kirby is seen back today for a follow up visit. Seen for Dr. Mare Ferrari. Has HTN, HLD, diabetes and chronic atrial fib, prior stroke in June of 2014, chronic anticoagulation, along with urinary retention/situational incontinence. Has long standing tobacco abuse and not interested in stopping.   Last seen here in December. This was after a syncopal spell at the urologist's office - now off of his metoprolol. Also noted to be orthostatic - advised to liberalize his fluids and salt.   Comes in today. Here with his son. Note from GU from March notes more issues with incontinence which seems to be a result of his dementia. Has large hydroceles - has elected observation. But doing ok. No chest pain. Not short of breath. Not dizzy or passing out. He is walking and trying to be active. Has gained a few pounds. Smoking a few cigarettes. Son feels like he is doing ok.    Current Outpatient Prescriptions  Medication Sig Dispense Refill  . ALPRAZolam (XANAX) 1 MG tablet Take 0.5-1 mg by mouth 4 (four) times daily -  with meals and at bedtime. Take half of a table three times a day and a whole tablet at bedtime.      Marland Kitchen diltiazem (CARDIZEM CD) 120 MG 24 hr capsule Take 1 capsule (120 mg total) by mouth daily.  30 capsule  1  . escitalopram (LEXAPRO) 10 MG tablet Take 10 mg by mouth daily at 12 noon.      . feeding supplement (ENSURE COMPLETE) LIQD Take 237 mLs by mouth 2 (two) times daily between meals.      . finasteride (PROSCAR) 5 MG tablet Take 5 mg by mouth daily at 12 noon.       . mirabegron ER (MYRBETRIQ) 25 MG TB24 tablet Take 25 mg by mouth daily.      . polyethylene glycol (MIRALAX / GLYCOLAX) packet Take 17 g by mouth daily.  30 each  2  . pravastatin (PRAVACHOL) 10 MG tablet Take 10 mg by mouth every evening.      . Rivaroxaban (XARELTO) 20 MG TABS tablet Take 20 mg by mouth daily at 12 noon.       .  silodosin (RAPAFLO) 8 MG CAPS capsule Take 8 mg by mouth daily with breakfast.       No current facility-administered medications for this visit.    No Known Allergies  Past Medical History  Diagnosis Date  . Hypertension   . Hernia     L groin   . Hyperlipidemia   . Pre-diabetes   . A-fib   . Stroke   . Urinary retention   . Protein calorie malnutrition   . GI bleed June 2014  . BPH (benign prostatic hyperplasia)     Past Surgical History  Procedure Laterality Date  . No past surgeries    . Tonsillectomy and adenoidectomy  age 39  . Colonoscopy N/A 07/13/2012    Procedure: COLONOSCOPY;  Surgeon: Lear Ng, MD;  Location: [facility] ENDOSCOPY;  Service: Endoscopy;  Laterality: N/A;    History  Smoking status  . Current Every Day Smoker -- 1.00 packs/day for 63 years  Smokeless tobacco  . Never Used    Comment: Smoked since age 75    History  Alcohol Use No    Family History  Problem Relation Age of Onset  . Other Mother     Passed  at 73 of "blood clot in chest"  . Colon cancer Father     Passed at 57  . Heart attack Sister     Passed away 70  . Dementia Sister     Review of Systems: The review of systems is per the HPI.  All other systems were reviewed and are negative.  Physical Exam: BP 110/60  Pulse 89  Ht 6\' 3"  (1.905 m)  Wt 129 lb 12.8 oz (58.877 kg)  BMI 16.22 kg/m2  SpO2 95% Patient is very pleasant and in no acute distress. He is thin but has gained 4 pounds since December. Quite tall. Skin is warm and dry. Color is normal.  HEENT is unremarkable. Normocephalic/atraumatic. PERRL. Sclera are nonicteric. Neck is supple. No masses. No JVD. Lungs are clear. Cardiac exam shows an irregular rhythm. HR in the 80's by me. Abdomen is soft. Extremities are without edema. Gait and ROM are intact. No gross neurologic deficits noted.  Wt Readings from Last 3 Encounters:  05/05/13 129 lb 12.8 oz (58.877 kg)  01/29/13 128 lb (58.06 kg)  01/09/13 125 lb  (56.7 kg)    LABORATORY DATA: PENDING  Lab Results  Component Value Date   WBC 7.5 01/04/2013   HGB 15.1 01/04/2013   HCT 43.6 01/04/2013   PLT 170 01/04/2013   GLUCOSE 110* 01/04/2013   CHOL 133 07/09/2012   TRIG 152* 07/09/2012   HDL 42 07/09/2012   LDLCALC 61 07/09/2012   ALT 16 07/08/2012   AST 20 07/08/2012   NA 136 01/04/2013   K 4.8 01/04/2013   CL 98 01/04/2013   CREATININE 1.18 01/04/2013   BUN 21 01/04/2013   CO2 27 01/04/2013   TSH 0.967 06/30/2012   PSA 3.14 06/30/2012   INR 1.31 08/07/2012   HGBA1C 5.7* 07/09/2012   Echo Study Conclusions from June 2014  - Left ventricle: The cavity size was normal. Wall thickness was normal. Systolic function was normal. The estimated ejection fraction was in the range of 50% to 55%. - Mitral valve: Mild regurgitation. - Tricuspid valve: Moderate regurgitation. - Pulmonary arteries: Systolic pressure was moderately increased. PA peak pressure: 64mm Hg (S). - Pericardium, extracardiac: A trivial pericardial effusion was identified.    Assessment / Plan: 1. Chronic atrial fib - managed with rate control and anticoagulation - recheck baseline labs today. No change in current therapy.   2. Past syncope - has not recurred.   3. Dementia  4. HLD - recheck labs today - on statin therapy  He seems to be holding his own. Continue with current regimen. See back in 4 months.   Patient is agreeable to this plan and will call if any problems develop in the interim.   Burtis Junes, RN, [place] [address] [place], [place]  [phone]

## 2013-05-05 NOTE — Patient Instructions (Signed)
Stay on your current medicines  See Dr. Mare Ferrari in 4 months  We will check lab today  Call the [place] office at [phone] if you have any questions, problems or concerns.

## 2013-08-27 NOTE — Telephone Encounter (Signed)
Noted  

## 2013-11-18 IMAGING — CT CT HEAD W/O CM
1 series · 16 of 30 positions shown, 20 images · non-contrast
Comparison: MRI 07/09/2012.  CT 07/08/2012

CLINICAL DATA: Recent stroke.  Altered mental status.  Follow-up.

CT HEAD WITHOUT CONTRAST
TECHNIQUE: Contiguous axial images were obtained from the base of
the skull through the vertex without contrast.

[Series 2: head 5.0 h30s · axial · 0.46mm/px · z∈[-131,+14]mm · 16 of 33 slices shown, 20 images]
[im 2/33  brain]
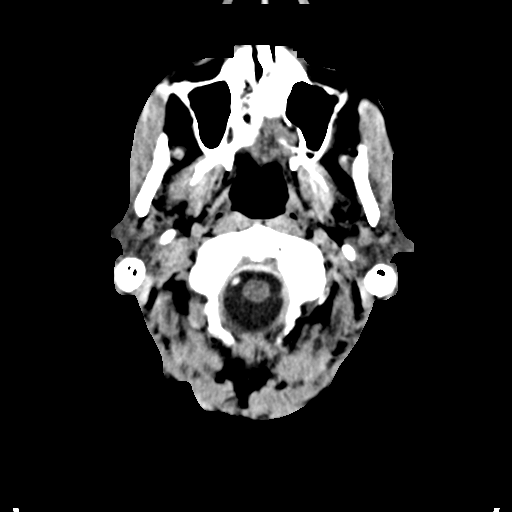
[im 2/33  bone]
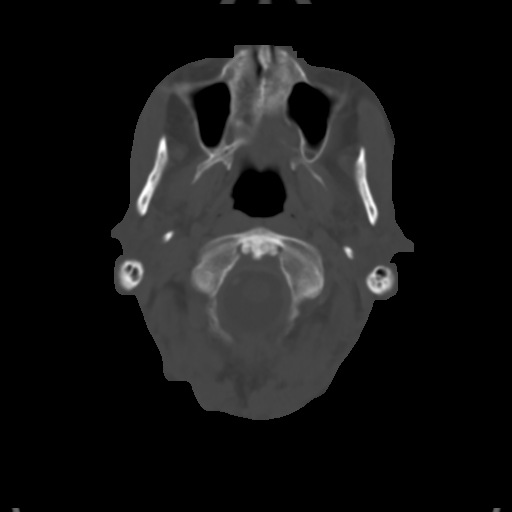
[im 4/33  brain]
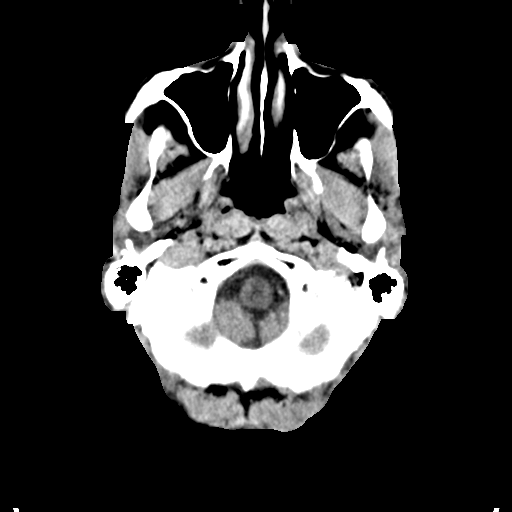
[im 6/33  brain]
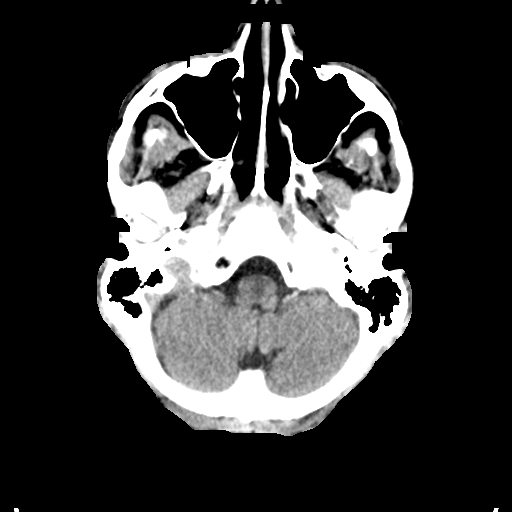
[im 8/33  brain]
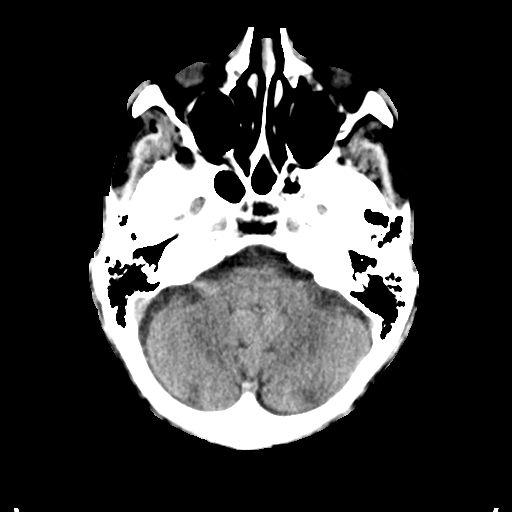
[im 9/33  brain]
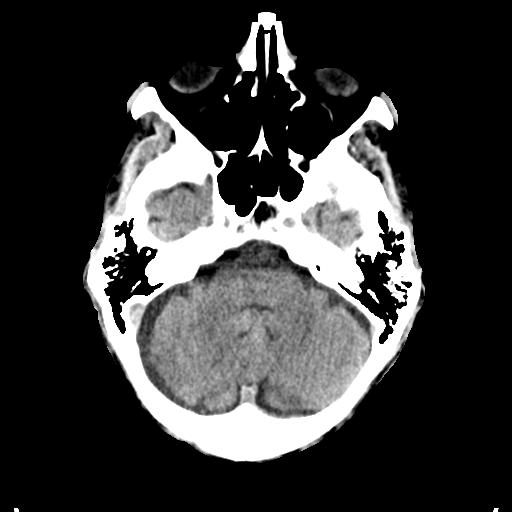
[im 9/33  bone]
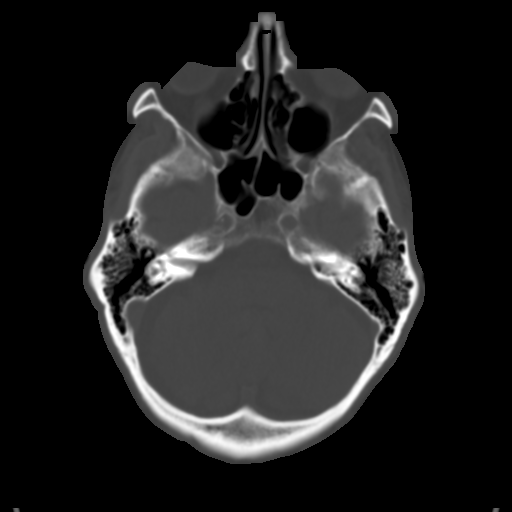
[im 12/33  brain]
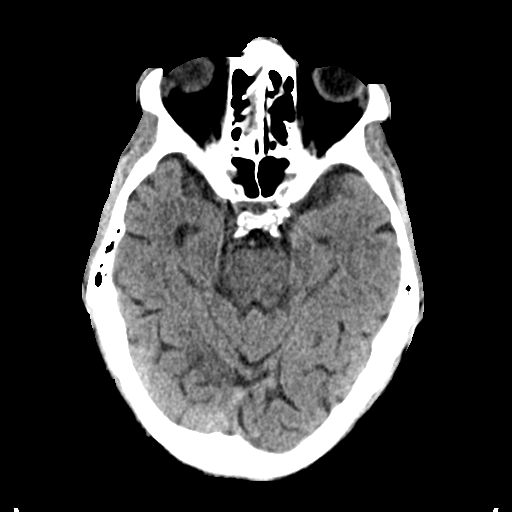
[im 14/33  brain]
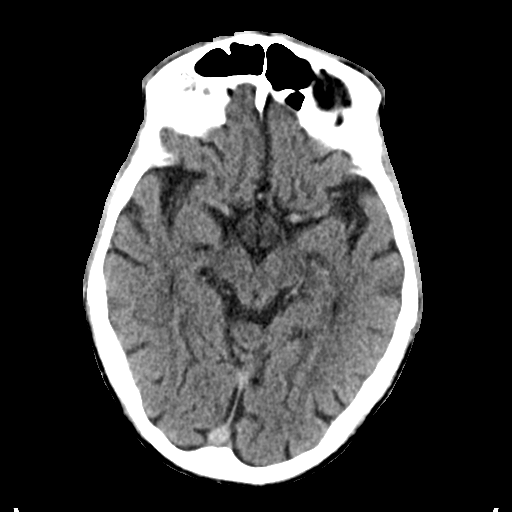
[im 16/33  brain]
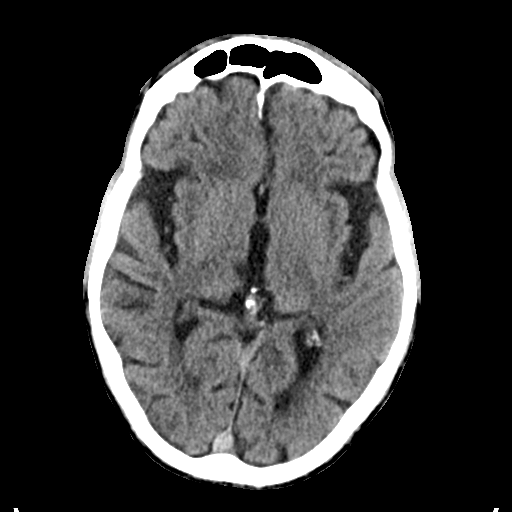
[im 17/33  brain]
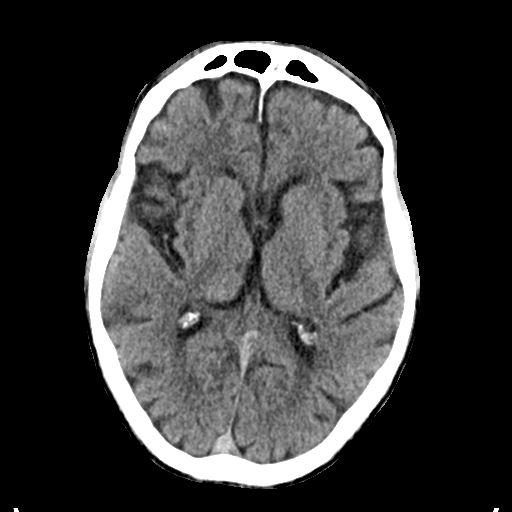
[im 17/33  bone]
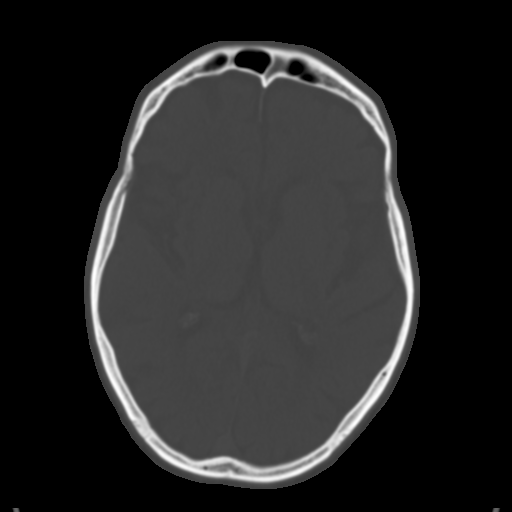
[im 19/33  brain]
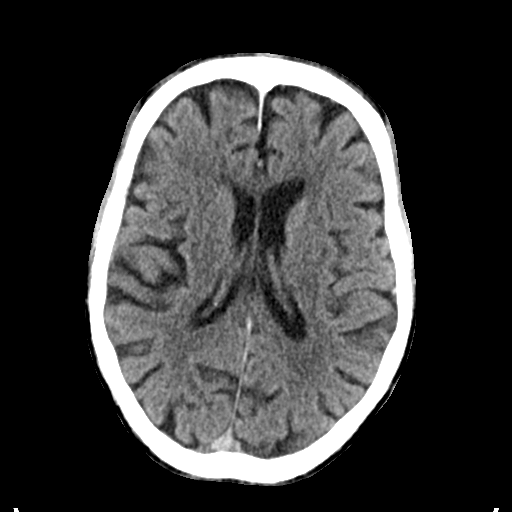
[im 21/33  brain]
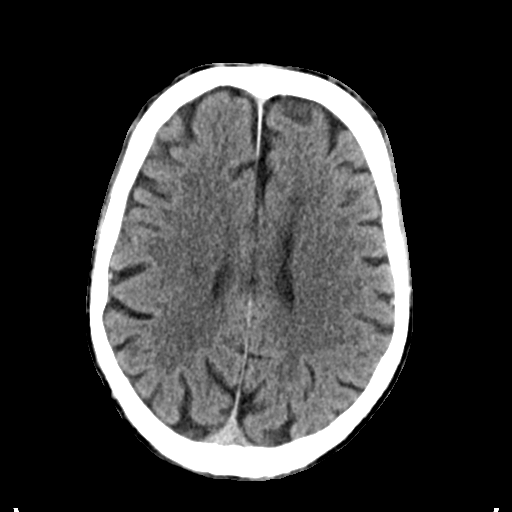
[im 24/33  brain]
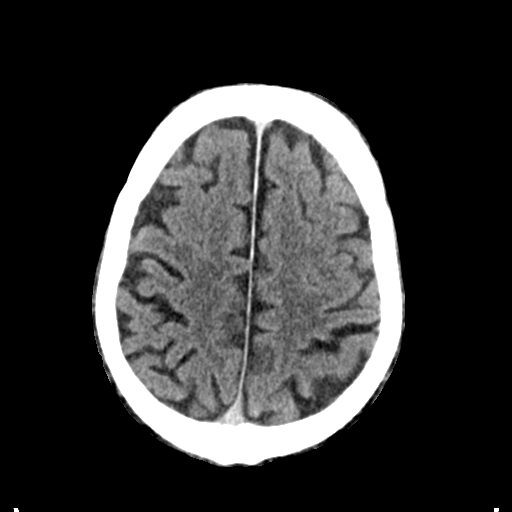
[im 25/33  brain]
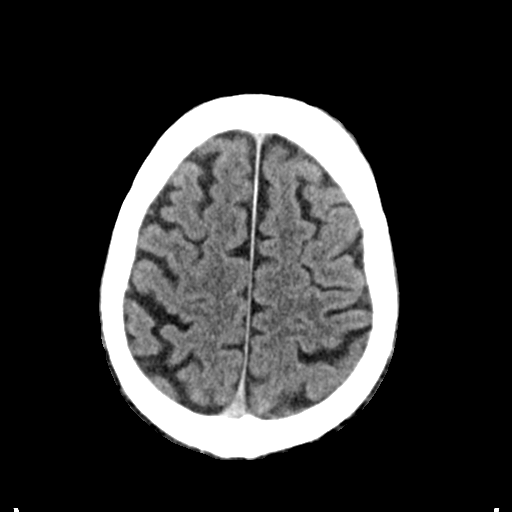
[im 25/33  bone]
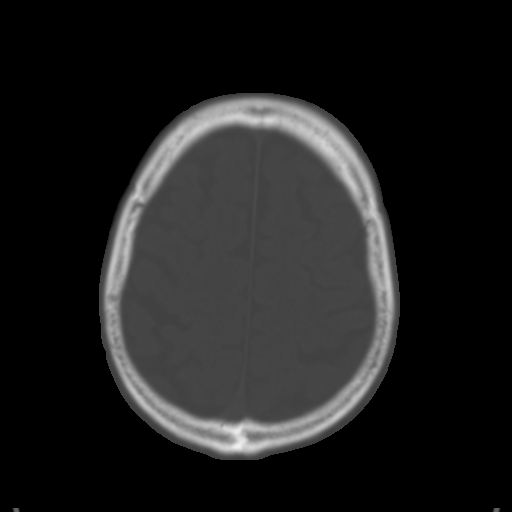
[im 27/33  brain]
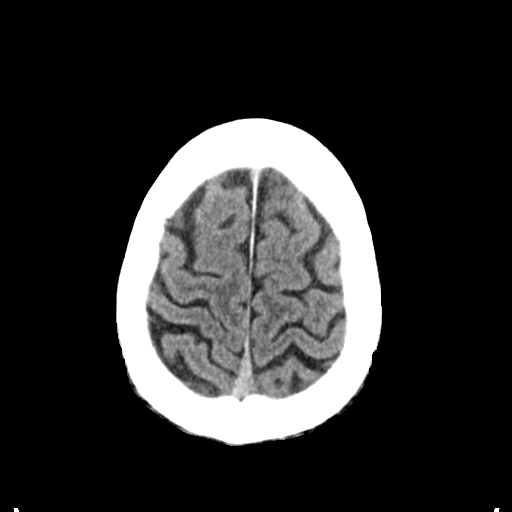
[im 29/33  brain]
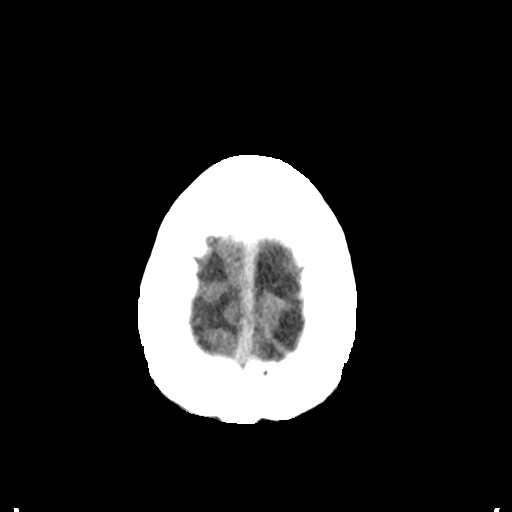
[im 31/33  brain]
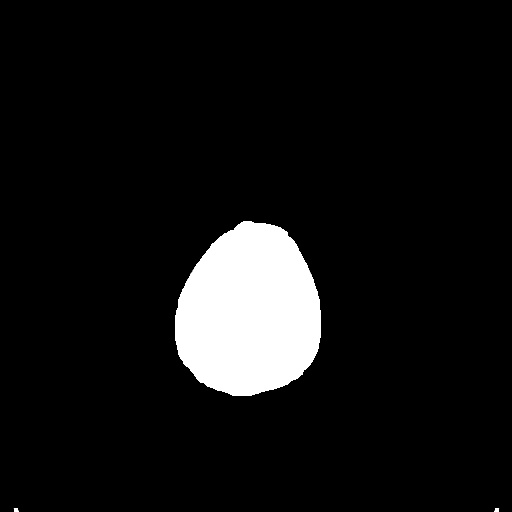

[16 of 30 positions shown; findings below may reference images not displayed]

FINDINGS: Generalized atrophy.  Mild chronic microvascular ischemic
change in the white matter.  Small chronic infarct right thalamus
medially is unchanged.  Negative for acute infarct.  Negative for
hemorrhage or mass lesion.  Calvarium is intact.
IMPRESSION: Atrophy and chronic microvascular ischemia.  No acute abnormality.

## 2013-12-09 ENCOUNTER — Encounter: Payer: Self-pay | Admitting: Neurology

## 2013-12-15 ENCOUNTER — Encounter: Payer: Self-pay | Admitting: Neurology

## 2014-04-02 ENCOUNTER — Other Ambulatory Visit: Payer: Self-pay | Admitting: Family Medicine

## 2014-04-02 DIAGNOSIS — R748 Abnormal levels of other serum enzymes: Secondary | ICD-10-CM

## 2014-04-16 ENCOUNTER — Ambulatory Visit
Admission: RE | Admit: 2014-04-16 | Discharge: 2014-04-16 | Disposition: A | Discharge status: Home / Self Care | Payer: Commercial Managed Care - HMO | Source: Ambulatory Visit | Attending: Family Medicine | Admitting: Family Medicine

## 2014-04-16 DIAGNOSIS — R748 Abnormal levels of other serum enzymes: Secondary | ICD-10-CM

## 2014-04-19 ENCOUNTER — Other Ambulatory Visit: Payer: Self-pay | Admitting: Family Medicine

## 2014-04-19 DIAGNOSIS — R160 Hepatomegaly, not elsewhere classified: Secondary | ICD-10-CM

## 2014-04-26 ENCOUNTER — Ambulatory Visit
Admission: RE | Admit: 2014-04-26 | Discharge: 2014-04-26 | Disposition: A | Discharge status: Home / Self Care | Payer: Commercial Managed Care - HMO | Source: Ambulatory Visit | Attending: Family Medicine | Admitting: Family Medicine

## 2014-04-26 DIAGNOSIS — R160 Hepatomegaly, not elsewhere classified: Secondary | ICD-10-CM

## 2014-04-26 MED ORDER — IOPAMIDOL (ISOVUE-300) INJECTION 61%
100.0000 mL | Freq: Once | INTRAVENOUS | Status: AC | PRN
  Administered 2014-04-26: 100 mL via INTRAVENOUS

## 2014-04-28 ENCOUNTER — Telehealth: Payer: Self-pay | Admitting: Oncology

## 2014-04-28 NOTE — Telephone Encounter (Signed)
NEW PATIENT APPT-S/W PATIENT SON Dayshawn Gosselin JR AND GAVE NP APPT FOR 04/19 @ 1:30 W/DR. SHERRILL.

## 2014-05-04 ENCOUNTER — Other Ambulatory Visit: Payer: Self-pay | Admitting: *Deleted

## 2014-05-04 ENCOUNTER — Telehealth: Payer: Self-pay | Admitting: Oncology

## 2014-05-04 NOTE — Telephone Encounter (Signed)
Per 04/12 POF pt is to be r/s from 04/19 to 04/18 @11 :30 per MD. POF has been deleted...Marland KitchenMarland KitchenMarland Kitchen KJ

## 2014-05-07 ENCOUNTER — Telehealth: Payer: Self-pay | Admitting: *Deleted

## 2014-05-07 ENCOUNTER — Encounter: Payer: Self-pay | Admitting: *Deleted

## 2014-05-07 NOTE — Telephone Encounter (Signed)
Call from pt's son: He asks that we not mention pt's life expectancy during office visit next week. He was unaware of change in appt date to 4/18. He will contact his brother to be sure he is aware, as he will bring pt to visit. Dr. Benay Spice made aware of request.

## 2014-05-10 ENCOUNTER — Encounter: Payer: Self-pay | Admitting: *Deleted

## 2014-05-10 ENCOUNTER — Ambulatory Visit: Payer: Commercial Managed Care - HMO

## 2014-05-10 ENCOUNTER — Telehealth: Payer: Self-pay | Admitting: Oncology

## 2014-05-10 ENCOUNTER — Other Ambulatory Visit (HOSPITAL_BASED_OUTPATIENT_CLINIC_OR_DEPARTMENT_OTHER): Payer: Commercial Managed Care - HMO

## 2014-05-10 ENCOUNTER — Encounter: Payer: Self-pay | Admitting: Oncology

## 2014-05-10 ENCOUNTER — Ambulatory Visit (HOSPITAL_BASED_OUTPATIENT_CLINIC_OR_DEPARTMENT_OTHER): Payer: Commercial Managed Care - HMO | Admitting: Oncology

## 2014-05-10 VITALS — BP 92/54 | HR 110 | Temp 98.5°F | Resp 18 | Ht 75.0 in | Wt 137.9 lb

## 2014-05-10 DIAGNOSIS — R918 Other nonspecific abnormal finding of lung field: Secondary | ICD-10-CM

## 2014-05-10 DIAGNOSIS — K769 Liver disease, unspecified: Secondary | ICD-10-CM

## 2014-05-10 DIAGNOSIS — I4891 Unspecified atrial fibrillation: Secondary | ICD-10-CM

## 2014-05-10 DIAGNOSIS — Z720 Tobacco use: Secondary | ICD-10-CM | POA: Diagnosis not present

## 2014-05-10 DIAGNOSIS — I10 Essential (primary) hypertension: Secondary | ICD-10-CM | POA: Diagnosis not present

## 2014-05-10 DIAGNOSIS — C801 Malignant (primary) neoplasm, unspecified: Secondary | ICD-10-CM

## 2014-05-10 DIAGNOSIS — J449 Chronic obstructive pulmonary disease, unspecified: Secondary | ICD-10-CM

## 2014-05-10 LAB — BASIC METABOLIC PANEL (CC13)
Anion Gap: 12 mEq/L — ABNORMAL HIGH (ref 3–11)
BUN: 22.6 mg/dL (ref 7.0–26.0)
CO2: 26 mEq/L (ref 22–29)
CREATININE: 1.5 mg/dL — AB (ref 0.7–1.3)
Calcium: 9.9 mg/dL (ref 8.4–10.4)
Chloride: 101 mEq/L (ref 98–109)
EGFR: 42 mL/min/{1.73_m2} — ABNORMAL LOW (ref >90)
GLUCOSE: 111 mg/dL (ref 70–140)
POTASSIUM: 4.9 meq/L (ref 3.5–5.1)
Sodium: 138 mEq/L (ref 136–145)

## 2014-05-10 NOTE — [facility] Oncology Navigator Note (Signed)
Met with patient, son Oreoluwa Aigner, and daughter, [name] during new patient visit. Explained the role of the GI Nurse Navigator and provided New Patient Packet with information on: 1.  Liver biopsy procedure 2. Support groups 3. Advanced Directives 4. Fall Safety Plan Answered questions, reviewed current treatment plan using TEACH back and provided emotional support. Provided copy of current treatment plan. Still requires staging to determine type of cancer he has as primary site.  Merceda Elks, RN, BSN GI Oncology [place]

## 2014-05-10 NOTE — Progress Notes (Signed)
[facility] New Patient Consult   Referring MD: Hulan Fess, [place], [place] [address]   Shannon Kirby 81 y.o.  1932/01/10    Reason for Referral: CT with evidence of liver and lung metastases   HPI: Mr. Shannon Kirby reports feeling well. He saw Dr. Rex Kirby on 03/03/2014 for a routine examination. A laboratory panel for drug monitoring was remarkable for elevated liver enzymes within alkaline phosphatase 165 and AST of 45. The MCV was elevated at 102 and the platelets were mildly low at 116. Additional laboratory evaluation included a negative ANA and hepatitis screen. He was referred for an abdominal ultrasound on 04/17/2014. Numerous hypoechoic nodules were noted throughout the liver.  A CT of the abdomen and pelvis on 04/26/2014 was remarkable for multiple nodules at the lung bases. Innumerable hypervascular lesions scattered throughout the liver consistent with metastatic disease. No biliary dilatation. No pancreas mass. A 9 mm exophytic lesion was noted in the medial aspect of the upper left kidney concerning for a small enhancing neoplasm. Focal area of masslike thickening in the body of the stomach. Left inguinal hernia containing small bowel. Tip of the appendix descends into a small right inguinal hernia. Left-sided retroperitoneal lymphadenopathy with an enhancing lymph node intimately associated with the left renal artery and vein. The prostate gland appeared unremarkable. Mr. Shannon Kirby denies abdominal pain.  Past Medical History  Diagnosis Date  . Hypertension   . Hernia     L inguinal   . Hyperlipidemia   . Pre-diabetes   . A-fib   . Stroke     X2  . Urinary retention   . Protein calorie malnutrition   . GI bleed June 2014  . BPH (benign prostatic hyperplasia)   . COPD (chronic obstructive pulmonary disease)   . Anxiety disorder     .  Dementia  Past Surgical History  Procedure Laterality Date       . Tonsillectomy and adenoidectomy  age 87    . Colonoscopy N/A 07/13/2012    Procedure: COLONOSCOPY;  Surgeon: Shannon Ng, MD;  Location[facility] ENDOSCOPY;  Service: Endoscopy;  Laterality: N/A;    Medications: Reviewed  Allergies: No Known Allergies  Family history: His father had "cancer ". He is not aware of the specific type of cancer. No other family history of cancer. He has one sister and 3 children.  Social History:   He lives with his daughter in [place]. He is retired from the [employer]. He smokes cigarettes. He does not use alcohol. No risk factor for HIV or hepatitis.  History  Alcohol Use No    History  Smoking status  . Current Every Day Smoker -- 1.00 packs/day for 63 years  Smokeless tobacco  . Never Used    Comment: Smoked since age 89      ROS:   Positives include: Nocturia, urinary hesitancy, "knees "are worn out  A complete ROS was otherwise negative.  Physical Exam:  Blood pressure 92/54, pulse 110, temperature 98.5 F (36.9 C), temperature source Oral, resp. rate 18, height 6\' 3"  (1.905 m), weight 137 lb 14.4 oz (62.551 kg), SpO2 97 %.  HEENT: Upper denture plate, multiple missing lower teeth, oropharynx without visible mass Lungs: Distant breath sounds, clear bilaterally Cardiac: Irregular Abdomen: The liver is enlarged and extends to the umbilicus and across the midline. The liver is firm and nontender. No apparent ascites. No splenomegaly. GU: Testes without mass, bilateral inguinal hernias with the left inguinal  hernia extending into the scrotum  Vascular: No leg edema Lymph nodes: No cervical, supraclavicular, axillary, or inguinal nodes Neurologic: Alert, follows commands, the motor exam appears grossly intact in the upper and lower extremities Skin: No rash Musculoskeletal: No spine tenderness   LAB:  CBC  Lab Results  Component Value Date   WBC 6.2 05/05/2013   HGB 15.1 05/05/2013   HCT 44.2 05/05/2013   MCV 99.3 05/05/2013   PLT 126.0* 05/05/2013    NEUTROABS 5.9 01/04/2013     CMP      Component Value Date/Time   NA 138 05/10/2014 1312   NA 141 05/05/2013 1434   K 4.9 05/10/2014 1312   K 4.9 05/05/2013 1434   CL 103 05/05/2013 1434   CO2 26 05/10/2014 1312   CO2 30 05/05/2013 1434   GLUCOSE 111 05/10/2014 1312   GLUCOSE 183* 05/05/2013 1434   BUN 22.6 05/10/2014 1312   BUN 21 05/05/2013 1434   CREATININE 1.5* 05/10/2014 1312   CREATININE 1.3 05/05/2013 1434   CALCIUM 9.9 05/10/2014 1312   CALCIUM 9.4 05/05/2013 1434   PROT 6.5 05/05/2013 1434   ALBUMIN 3.9 05/05/2013 1434   AST 22 05/05/2013 1434   ALT 24 05/05/2013 1434   ALKPHOS 61 05/05/2013 1434   BILITOT 0.4 05/05/2013 1434   GFRNONAA 57* 01/04/2013 1937   GFRAA 66* 01/04/2013 1937      Imaging:  Abdomen/pelvis CT from 04/26/2014 reviewed   Assessment/Plan:   1. Innumerable hypodense liver lesions, lower lung nodules, and retroperitoneal lymphadenopathy on a abdomen/pelvis CT 04/26/2014  Masslike thickening of the body of the stomach on the CT 04/26/2014  2. 9 mm hyperenhancing left renal lesion on the CT 04/26/2014  3.   COPD  4.   Atrial fibrillation  5.   Prostatic hypertrophy  6.   Bilateral inguinal hernias  7.   Hypertension  8.    Ongoing tobacco use   Disposition:   Mr. Shannon Kirby appears to have extensive metastatic disease involving the liver. He does not have symptoms to suggest a primary tumor site and he appears asymptomatic from the liver tumor burden. The CT 04/26/2014 reveals masslike thickening in the body of the stomach and a small left renal lesion/left retroperitoneal lymphadenopathy. It is possible he has a gastric cancer or a renal cell carcinoma. The differential diagnosis includes metastatic disease from another site.  I discussed the differential diagnosis with Mr. Shannon Kirby and his family. We will obtain a chest CT given his smoking history. He will be referred for a biopsy of the liver and return for an office visit next week.  The Xarelto will be placed on hold beginning today.  Approximately 50 minutes were spent with the patient today. The majority of the time was used for counseling and coordination of care.  [place], [place] 05/10/2014, 3:28 PM

## 2014-05-10 NOTE — Progress Notes (Signed)
Checked in new pt with no financial concerns. °

## 2014-05-10 NOTE — Telephone Encounter (Signed)
Gave avs & calendar for April. Sent patient back to labs

## 2014-05-11 ENCOUNTER — Ambulatory Visit: Payer: Commercial Managed Care - HMO | Admitting: Oncology

## 2014-05-11 ENCOUNTER — Ambulatory Visit: Payer: Commercial Managed Care - HMO

## 2014-05-11 LAB — CEA: CEA: 1.6 ng/mL (ref 0.0–5.0)

## 2014-05-11 LAB — PSA: PSA: 0.44 ng/mL (ref <=4.00)

## 2014-05-12 ENCOUNTER — Telehealth: Payer: Self-pay | Admitting: Oncology

## 2014-05-12 ENCOUNTER — Telehealth: Payer: Self-pay | Admitting: *Deleted

## 2014-05-12 ENCOUNTER — Other Ambulatory Visit: Payer: Self-pay | Admitting: *Deleted

## 2014-05-12 DIAGNOSIS — C801 Malignant (primary) neoplasm, unspecified: Secondary | ICD-10-CM

## 2014-05-12 NOTE — Telephone Encounter (Signed)
Called pt's home, spoke with Deb who reports she is his live-in caregiver. Deb confirms pt is not taking anticoagulant. She asks if it is OK for him to take medications prior to CT. YES. Encouraged her to have pt call office with any questions.

## 2014-05-12 NOTE — Telephone Encounter (Signed)
Wife aware of appointment for labs 4/21

## 2014-05-13 ENCOUNTER — Telehealth: Payer: Self-pay | Admitting: *Deleted

## 2014-05-13 ENCOUNTER — Other Ambulatory Visit: Payer: Self-pay | Admitting: Radiology

## 2014-05-13 ENCOUNTER — Ambulatory Visit (HOSPITAL_COMMUNITY)
Admission: RE | Admit: 2014-05-13 | Discharge: 2014-05-13 | Disposition: A | Discharge status: Home / Self Care | Payer: Commercial Managed Care - HMO | Source: Ambulatory Visit | Attending: Oncology | Admitting: Oncology

## 2014-05-13 ENCOUNTER — Encounter (HOSPITAL_COMMUNITY): Payer: Self-pay

## 2014-05-13 ENCOUNTER — Other Ambulatory Visit (HOSPITAL_BASED_OUTPATIENT_CLINIC_OR_DEPARTMENT_OTHER): Payer: Commercial Managed Care - HMO

## 2014-05-13 DIAGNOSIS — I4891 Unspecified atrial fibrillation: Secondary | ICD-10-CM | POA: Diagnosis not present

## 2014-05-13 DIAGNOSIS — C801 Malignant (primary) neoplasm, unspecified: Secondary | ICD-10-CM

## 2014-05-13 LAB — PROTIME-INR
INR: 1.3 — ABNORMAL LOW (ref 2.00–3.50)
Protime: 15.6 Seconds — ABNORMAL HIGH (ref 10.6–13.4)

## 2014-05-13 LAB — CBC WITH DIFFERENTIAL/PLATELET
BASO%: 0.5 % (ref 0.0–2.0)
BASOS ABS: 0 10*3/uL (ref 0.0–0.1)
EOS ABS: 0.1 10*3/uL (ref 0.0–0.5)
EOS%: 0.7 % (ref 0.0–7.0)
HCT: 49.6 % (ref 38.4–49.9)
HGB: 16.9 g/dL (ref 13.0–17.1)
LYMPH%: 15.2 % (ref 14.0–49.0)
MCH: 33.7 pg — ABNORMAL HIGH (ref 27.2–33.4)
MCHC: 34.1 g/dL (ref 32.0–36.0)
MCV: 99 fL — AB (ref 79.3–98.0)
MONO#: 1.1 10*3/uL — AB (ref 0.1–0.9)
MONO%: 14.4 % — AB (ref 0.0–14.0)
NEUT#: 5.1 10*3/uL (ref 1.5–6.5)
NEUT%: 69.2 % (ref 39.0–75.0)
NRBC: 0 % (ref 0–0)
Platelets: 107 10*3/uL — ABNORMAL LOW (ref 140–400)
RBC: 5.01 10*6/uL (ref 4.20–5.82)
RDW: 14 % (ref 11.0–14.6)
WBC: 7.4 10*3/uL (ref 4.0–10.3)
lymph#: 1.1 10*3/uL (ref 0.9–3.3)

## 2014-05-13 NOTE — Telephone Encounter (Signed)
TC from pt's wife. Patient is scheduled for liver biopsy tomorrow @1pm . Wife wanting to know when patient should stop having anything by mouth prior to procedure.  Instructed to Radiology @ [phone]. She verbalized understanding.

## 2014-05-14 ENCOUNTER — Telehealth: Payer: Self-pay | Admitting: *Deleted

## 2014-05-14 ENCOUNTER — Ambulatory Visit (HOSPITAL_COMMUNITY)
Admission: RE | Admit: 2014-05-14 | Discharge: 2014-05-14 | Disposition: A | Discharge status: Home / Self Care | Payer: Commercial Managed Care - HMO | Source: Ambulatory Visit | Attending: Diagnostic Radiology | Admitting: Diagnostic Radiology

## 2014-05-14 ENCOUNTER — Encounter (HOSPITAL_COMMUNITY): Payer: Self-pay

## 2014-05-14 ENCOUNTER — Ambulatory Visit (HOSPITAL_COMMUNITY)
Admission: RE | Admit: 2014-05-14 | Discharge: 2014-05-14 | Disposition: A | Discharge status: Home / Self Care | Payer: Commercial Managed Care - HMO | Source: Ambulatory Visit | Attending: Oncology | Admitting: Oncology

## 2014-05-14 ENCOUNTER — Ambulatory Visit (HOSPITAL_COMMUNITY): Admission: RE | Admit: 2014-05-14 | Payer: Commercial Managed Care - HMO | Source: Ambulatory Visit

## 2014-05-14 DIAGNOSIS — I10 Essential (primary) hypertension: Secondary | ICD-10-CM | POA: Insufficient documentation

## 2014-05-14 DIAGNOSIS — Z79899 Other long term (current) drug therapy: Secondary | ICD-10-CM | POA: Insufficient documentation

## 2014-05-14 DIAGNOSIS — Z7901 Long term (current) use of anticoagulants: Secondary | ICD-10-CM | POA: Diagnosis not present

## 2014-05-14 DIAGNOSIS — C7951 Secondary malignant neoplasm of bone: Secondary | ICD-10-CM | POA: Insufficient documentation

## 2014-05-14 DIAGNOSIS — F17210 Nicotine dependence, cigarettes, uncomplicated: Secondary | ICD-10-CM | POA: Diagnosis not present

## 2014-05-14 DIAGNOSIS — I4891 Unspecified atrial fibrillation: Secondary | ICD-10-CM | POA: Diagnosis not present

## 2014-05-14 DIAGNOSIS — E785 Hyperlipidemia, unspecified: Secondary | ICD-10-CM | POA: Insufficient documentation

## 2014-05-14 DIAGNOSIS — R7309 Other abnormal glucose: Secondary | ICD-10-CM | POA: Diagnosis not present

## 2014-05-14 DIAGNOSIS — C228 Malignant neoplasm of liver, primary, unspecified as to type: Secondary | ICD-10-CM | POA: Insufficient documentation

## 2014-05-14 DIAGNOSIS — N400 Enlarged prostate without lower urinary tract symptoms: Secondary | ICD-10-CM | POA: Insufficient documentation

## 2014-05-14 DIAGNOSIS — Z8673 Personal history of transient ischemic attack (TIA), and cerebral infarction without residual deficits: Secondary | ICD-10-CM | POA: Diagnosis not present

## 2014-05-14 DIAGNOSIS — J449 Chronic obstructive pulmonary disease, unspecified: Secondary | ICD-10-CM | POA: Insufficient documentation

## 2014-05-14 DIAGNOSIS — F419 Anxiety disorder, unspecified: Secondary | ICD-10-CM | POA: Diagnosis not present

## 2014-05-14 DIAGNOSIS — C801 Malignant (primary) neoplasm, unspecified: Secondary | ICD-10-CM

## 2014-05-14 HISTORY — DX: Cardiac arrhythmia, unspecified: I49.9

## 2014-05-14 LAB — CBC WITH DIFFERENTIAL/PLATELET
BASOS ABS: 0.1 10*3/uL (ref 0.0–0.1)
BASOS PCT: 1 % (ref 0–1)
EOS PCT: 1 % (ref 0–5)
Eosinophils Absolute: 0 10*3/uL (ref 0.0–0.7)
HCT: 48.6 % (ref 39.0–52.0)
Hemoglobin: 16.3 g/dL (ref 13.0–17.0)
LYMPHS ABS: 0.7 10*3/uL (ref 0.7–4.0)
Lymphocytes Relative: 11 % — ABNORMAL LOW (ref 12–46)
MCH: 33.6 pg (ref 26.0–34.0)
MCHC: 33.5 g/dL (ref 30.0–36.0)
MCV: 100.2 fL — ABNORMAL HIGH (ref 78.0–100.0)
MONO ABS: 1 10*3/uL (ref 0.1–1.0)
Monocytes Relative: 15 % — ABNORMAL HIGH (ref 3–12)
NEUTROS PCT: 73 % (ref 43–77)
Neutro Abs: 4.8 10*3/uL (ref 1.7–7.7)
PLATELETS: 102 10*3/uL — AB (ref 150–400)
RBC: 4.85 MIL/uL (ref 4.22–5.81)
RDW: 14.1 % (ref 11.5–15.5)
WBC: 6.6 10*3/uL (ref 4.0–10.5)

## 2014-05-14 LAB — COMPREHENSIVE METABOLIC PANEL
ALK PHOS: 266 U/L — AB (ref 39–117)
ALT: 53 U/L (ref 0–53)
AST: 72 U/L — ABNORMAL HIGH (ref 0–37)
Albumin: 3.6 g/dL (ref 3.5–5.2)
Anion gap: 11 (ref 5–15)
BUN: 31 mg/dL — ABNORMAL HIGH (ref 6–23)
CO2: 23 mmol/L (ref 19–32)
Calcium: 9.3 mg/dL (ref 8.4–10.5)
Chloride: 102 mmol/L (ref 96–112)
Creatinine, Ser: 1.48 mg/dL — ABNORMAL HIGH (ref 0.50–1.35)
GFR calc Af Amer: 49 mL/min — ABNORMAL LOW (ref >90)
GFR, EST NON AFRICAN AMERICAN: 43 mL/min — AB (ref >90)
GLUCOSE: 108 mg/dL — AB (ref 70–99)
POTASSIUM: 4.4 mmol/L (ref 3.5–5.1)
Sodium: 136 mmol/L (ref 135–145)
TOTAL PROTEIN: 6.9 g/dL (ref 6.0–8.3)
Total Bilirubin: 3 mg/dL — ABNORMAL HIGH (ref 0.3–1.2)

## 2014-05-14 LAB — PROTIME-INR
INR: 1.07 (ref 0.00–1.49)
PROTHROMBIN TIME: 14 s (ref 11.6–15.2)

## 2014-05-14 LAB — APTT: aPTT: 29 seconds (ref 24–37)

## 2014-05-14 MED ORDER — OXYCODONE HCL 5 MG PO TABS
5.0000 mg | ORAL_TABLET | ORAL | Status: DC | PRN
  Filled 2014-05-14: qty 1

## 2014-05-14 MED ORDER — FENTANYL CITRATE (PF) 100 MCG/2ML IJ SOLN
INTRAMUSCULAR | Status: AC | PRN
  Administered 2014-05-14: 12.5 ug via INTRAVENOUS

## 2014-05-14 MED ORDER — FLUMAZENIL 0.5 MG/5ML IV SOLN
INTRAVENOUS | Status: AC
  Filled 2014-05-14: qty 5

## 2014-05-14 MED ORDER — NALOXONE HCL 0.4 MG/ML IJ SOLN
INTRAMUSCULAR | Status: AC
  Filled 2014-05-14: qty 1

## 2014-05-14 MED ORDER — FENTANYL CITRATE (PF) 100 MCG/2ML IJ SOLN
INTRAMUSCULAR | Status: AC
  Filled 2014-05-14: qty 2

## 2014-05-14 MED ORDER — MIDAZOLAM HCL 2 MG/2ML IJ SOLN
INTRAMUSCULAR | Status: AC | PRN
  Administered 2014-05-14: 0.5 mg via INTRAVENOUS

## 2014-05-14 MED ORDER — MIDAZOLAM HCL 2 MG/2ML IJ SOLN
INTRAMUSCULAR | Status: AC
  Filled 2014-05-14: qty 2

## 2014-05-14 MED ORDER — SODIUM CHLORIDE 0.9 % IV SOLN
INTRAVENOUS | Status: DC
  Administered 2014-05-14: 12:00:00 via INTRAVENOUS

## 2014-05-14 NOTE — Discharge Instructions (Signed)
Conscious Sedation Sedation is the use of medicines to promote relaxation and relieve discomfort and anxiety. Conscious sedation is a type of sedation. Under conscious sedation you are less alert than normal but are still able to respond to instructions or stimulation. Conscious sedation is used during short medical and dental procedures. It is milder than deep sedation or general anesthesia and allows you to return to your regular activities sooner.  LET [facility] CARE PROVIDER KNOW ABOUT:   Any allergies you have.  All medicines you are taking, including vitamins, herbs, eye drops, creams, and over-the-counter medicines.  Use of steroids (by mouth or creams).  Previous problems you or members of your family have had with the use of anesthetics.  Any blood disorders you have.  Previous surgeries you have had.  Medical conditions you have.  Possibility of pregnancy, if this applies.  Use of cigarettes, alcohol, or illegal drugs. RISKS AND COMPLICATIONS Generally, this is a safe procedure. However, as with any procedure, problems can occur. Possible problems include:  Oversedation.  Trouble breathing on your own. You may need to have a breathing tube until you are awake and breathing on your own.  Allergic reaction to any of the medicines used for the procedure. BEFORE THE PROCEDURE  You may have blood tests done. These tests can help show how well your kidneys and liver are working. They can also show how well your blood clots.  A physical exam will be done.  Only take medicines as directed by your health care provider. You may need to stop taking medicines (such as blood thinners, aspirin, or nonsteroidal anti-inflammatory drugs) before the procedure.   Do not eat or drink at least 6 hours before the procedure or as directed by your health care provider.  Arrange for a responsible adult, family member, or friend to take you home after the procedure. He or she should stay  with you for at least 24 hours after the procedure, until the medicine has worn off. PROCEDURE   An intravenous (IV) catheter will be inserted into one of your veins. Medicine will be able to flow directly into your body through this catheter. You may be given medicine through this tube to help prevent pain and help you relax.  The medical or dental procedure will be done. AFTER THE PROCEDURE  You will stay in a recovery area until the medicine has worn off. Your blood pressure and pulse will be checked.   Depending on the procedure you had, you may be allowed to go home when you can tolerate liquids and your pain is under control. Document Released: 10/03/2000 Document Revised: 01/13/2013 Document Reviewed: 09/15/2012 [facility] Patient Information 2015 [place], [place]. This information is not intended to replace advice given to you by your health care provider. Make sure you discuss any questions you have with your health care provider. Liver Biopsy, Care After Refer to this sheet in the next few weeks. These instructions provide you with information on caring for yourself after your procedure. Your health care provider may also give you more specific instructions. Your treatment has been planned according to current medical practices, but problems sometimes occur. Call your health care provider if you have any problems or questions after your procedure. WHAT TO EXPECT AFTER THE PROCEDURE After your procedure, it is typical to have the following:  A small amount of discomfort in the area where the biopsy was done and in the right shoulder or shoulder blade.  A small amount of  bruising around the area where the biopsy was done and on the skin over the liver.  Sleepiness and fatigue for the rest of the day. HOME CARE INSTRUCTIONS   Rest at home for 1-2 days or as directed by your health care provider.  Have a friend or family member stay with you for at least 24 hours.  Because of the  medicines used during the procedure, you should not do the following things in the first 24 hours:  Drive.  Use machinery.  Be responsible for the care of other people.  Sign legal documents.  Take a bath or shower.  There are many different ways to close and cover an incision, including stitches, skin glue, and adhesive strips. Follow your health care provider's instructions on:  Incision care.  Bandage (dressing) changes and removal.  Incision closure removal.  Do not drink alcohol in the first week.  Do not lift more than 5 pounds or play contact sports for 2 weeks after this test.  Take medicines only as directed by your health care provider. Do not take medicine containing aspirin or non-steroidal anti-inflammatory medicines such as ibuprofen for 1 week after this test.  It is your responsibility to get your test results. SEEK MEDICAL CARE IF:   You have increased bleeding from an incision that results in more than a small spot of blood.  You have redness, swelling, or increasing pain in any incisions.  You notice a discharge or a bad smell coming from any of your incisions.  You have a fever or chills. SEEK IMMEDIATE MEDICAL CARE IF:   You develop swelling, bloating, or pain in your abdomen.  You become dizzy or faint.  You develop a rash.  You are nauseous or vomit.  You have difficulty breathing, feel short of breath, or feel faint.  You develop chest pain.  You have problems with your speech or vision.  You have trouble balancing or moving your arms or legs. Document Released: 07/28/2004 Document Revised: 05/25/2013 Document Reviewed: 03/06/2013 [facility] Patient Information 2015 [place], [place]. This information is not intended to replace advice given to you by your health care provider. Make sure you discuss any questions you have with your health care provider. Liver Biopsy The liver is a large organ in the upper right-hand side of your abdomen. A  liver biopsy is a procedure in which a tissue sample is taken from the liver and examined under a microscope. The procedure is done to confirm a suspected problem. There are three types of liver biopsies:  Percutaneous. In this type, an incision is made in your abdomen. The sample is removed through the incision with a needle.  Laparoscopic. In this type, several incisions are made in the abdomen. A tiny camera is passed through one of the incisions to help guide the health care provider. The sample is removed through the other incision or incisions.  Transjugular. In this type, an incision is made in the neck. A tube is passed through the incision to the liver. The sample is removed through the tube with a needle. LET [facility] CARE PROVIDER KNOW ABOUT:  Any allergies you have.  All medicines you are taking, including vitamins, herbs, eye drops, creams, and over-the-counter medicines.  Previous problems you or members of your family have had with the use of anesthetics.  Any blood disorders you have.  Previous surgeries you have had.  Medical conditions you have.  Possibility of pregnancy, if this applies. RISKS AND COMPLICATIONS Generally,  this is a safe procedure. However, problems can occur and include:  Bleeding.  Infection.  Bruising.  Collapsed lung.  Leak of digestive juices (bile) from the liver or gallbladder.  Problems with heart rhythm.  Pain at the biopsy site or in the right shoulder.  Low blood pressure (hypotension).  Injury to nearby organs or tissues. BEFORE THE PROCEDURE  Your health care provider may do some blood or urine tests. These will help your health care provider learn how well your kidneys and liver are working and how well your blood clots.  Ask your health care provider if you will be able to go home the day of the procedure. Arrange for someone to take you home and stay with you for at least 24 hours.  Do not eat or drink anything  after midnight on the night before the procedure or as directed by your health care provider.  Ask your health care provider about:  Changing or stopping your regular medicines. This is especially important if you are taking diabetes medicines or blood thinners.  Taking medicines such as aspirin and ibuprofen. These medicines can thin your blood. Do not take these medicines before your procedure if your health care provider asks you not to. PROCEDURE Regardless of the type of biopsy that will be done, you will have an IV line placed. Through this line, you will receive fluids and medicine to relax you. If you will be having a laparoscopic biopsy, you may also receive medicine through this line to make you sleep during the procedure (general anesthetic). Percutaneous Liver Biopsy  You will positioned on your back, with your right hand over your head.  A health care provider will locate your liver by tapping and pressing on the right side of your abdomen or with the help of an ultrasound machine or CT scan.  An area at the bottom of your last right rib will be numbed.  An incision will be made in the numbed area.  The biopsy needle will be inserted into the incision.  Several samples of liver tissue will be taken with the biopsy needle. You will be asked to hold your breath as each sample is taken. Laparoscopic Liver Biopsy  You will be positioned on your back.  Several small incisions will be made in your abdomen.  Your doctor will pass a tiny camera through one incision. The camera will allow the liver to be viewed on a TV monitor in the operating room.  Tools will be passed through the other incision or incisions. These tools will be used to remove samples of liver tissue. Transjugular Liver Biopsy  You will be positioned on your back on an X-ray table, with your head turned to your left.  An area on your neck just over your jugular vein will be numbed.  An incision will be made  in the numbed area.  A tiny tube will be inserted through the incision. It will be pushed through the jugular vein to a blood vessel in the liver called the hepatic vein.  Dye will be inserted through the tube, and X-rays will be taken. The dye will make the blood vessels in the liver light up on the X-rays.  The biopsy needle will be pushed through the tube until it reaches the liver.  Samples of liver tissue will be taken with the biopsy needle.  The needle and the tube will be removed. After the samples are obtained, the incision or incisions will be closed. AFTER  THE PROCEDURE  You will be taken to a recovery area.  You may have to lie on your right side for 1-2 hours. This will prevent bleeding from the biopsy site.  Your progress will be watched. Your blood pressure, pulse, and the biopsy site will be checked often.  You may have some pain or feel sick. If this happens, tell your health care provider.  As you begin to feel better, you will be offered ice and beverages.  You may be allowed to go home when the medicines have worn off and you can walk, drink, eat, and use the bathroom. Document Released: 03/31/2003 Document Revised: 05/25/2013 Document Reviewed: 03/06/2013 [facility] Patient Information 2015 [place], [place]. This information is not intended to replace advice given to you by your health care provider. Make sure you discuss any questions you have with your health care provider.

## 2014-05-14 NOTE — Telephone Encounter (Signed)
Asking if OK for Lamon to take his Xanax prior to leaving house for biopsy today? She reports he is so anxious and afraid. Confirmed with radiology OK to take his Xanax with sip of water. He must not drive today. Explained the procedure to wife and to monitor for bleeding at site afterwards.

## 2014-05-14 NOTE — H&P (Signed)
Chief Complaint:  "I'm here for a liver biopsy"  Referring Physician(s): Sherrill,Gary B  History of Present Illness: Shannon Kirby is a 81 y.o. male with recently noted elevated liver function tests on routine physical exam with subsequent imaging revealing multiple liver lesions, numerous pulmonary nodules, retroperitoneal adenopathy, left upper pole renal lesion, ascites, multifocal lytic bone metastasis. He presents today for ultrasound-guided liver lesion biopsy.  Past Medical History  Diagnosis Date  . Hypertension   . Hernia     L groin   . Hyperlipidemia   . Pre-diabetes   . A-fib   . Stroke     X2  . Urinary retention   . Protein calorie malnutrition   . GI bleed June 2014  . BPH (benign prostatic hyperplasia)   . COPD (chronic obstructive pulmonary disease)   . Anxiety disorder   . Dysrhythmia     afib with stroke    Past Surgical History  Procedure Laterality Date  . No past surgeries    . Tonsillectomy and adenoidectomy  age 8  . Colonoscopy N/A 07/13/2012    Procedure: COLONOSCOPY;  Surgeon: Lear Ng, MD;  Location: [facility];  Service: Endoscopy;  Laterality: N/A;    Allergies: Review of patient's allergies indicates no known allergies.  Medications: Prior to Admission medications   Medication Sig Start Date End Date Taking? Authorizing Provider  ALPRAZolam Duanne Moron) 1 MG tablet Take 0.5-1 mg by mouth 4 (four) times daily -  with meals and at bedtime. Take half of a table three times a day and a whole tablet at bedtime.   Yes Historical Provider, MD  diltiazem (CARDIZEM CD) 120 MG 24 hr capsule Take 1 capsule (120 mg total) by mouth daily. 07/01/12  Yes Bonnielee Haff, MD  escitalopram (LEXAPRO) 20 MG tablet Take 20 mg by mouth daily with lunch.  05/03/14  Yes Historical Provider, MD  finasteride (PROSCAR) 5 MG tablet Take 5 mg by mouth daily with supper.  07/08/12  Yes Historical Provider, MD  pravastatin (PRAVACHOL) 10 MG tablet Take 10 mg  by mouth every evening.   Yes Historical Provider, MD  feeding supplement (ENSURE COMPLETE) LIQD Take 237 mLs by mouth 2 (two) times daily between meals. 07/14/12   Barton Dubois, MD  polyethylene glycol [facility] / GLYCOLAX) packet Take 17 g by mouth daily. Patient taking differently: Take 17 g by mouth daily as needed for mild constipation.  07/14/12   Barton Dubois, MD  Rivaroxaban (XARELTO) 20 MG TABS tablet Take 20 mg by mouth daily at 12 noon.     Historical Provider, MD  tamsulosin (FLOMAX) 0.4 MG CAPS capsule Take 0.4 mg by mouth every morning.  05/01/14   Historical Provider, MD    Family History  Problem Relation Age of Onset  . Other Mother     Passed at 59 of "blood clot in chest"  . Colon cancer Father     Passed at 64  . Heart attack Sister     Passed away 25  . Dementia Sister     History   Social History  . Marital Status: Single    Spouse Name: N/A  . Number of Children: 3  . Years of Education: 9th   Occupational History  . Retired    Social History Main Topics  . Smoking status: Current Every Day Smoker -- 1.00 packs/day for 63 years  . Smokeless tobacco: Never Used     Comment: Smoked since age 60  .  Alcohol Use: No  . Drug Use: No  . Sexual Activity: Not Currently   Other Topics Concern  . None   Social History Narrative   Single, daughter [name] lives with him   Has total #3 children   Transportation is son, Amilio Zehnder (now has "bad knees")   Ambulatory- Fall Risk      Review of Systems  Constitutional: Negative for fever and chills.  Respiratory: Negative for cough and shortness of breath.   Cardiovascular: Negative for chest pain.  Gastrointestinal: Negative for nausea, vomiting, abdominal pain and blood in stool.  Genitourinary: Negative for dysuria and hematuria.  Musculoskeletal: Negative for back pain.  Neurological: Negative for headaches.    Vital Signs: Blood pressure 107/67, heart rate 82,  respirations 16, oxygen saturation is 97% room air  Physical Exam  Constitutional:  . White male in no acute distress  Cardiovascular: Normal rate.   Irregular rhythm  Pulmonary/Chest: Effort normal.  Slightly diminished breath sounds left base, right clear  Abdominal: Soft. Bowel sounds are normal. There is no tenderness.  Hepatomegaly  Musculoskeletal: Normal range of motion. He exhibits no edema.  Neurological:  Mild dementia, pleasantly cooperative    Imaging: Ct Abdomen Pelvis W Wo Contrast  04/26/2014   ADDENDUM REPORT: 04/26/2014 14:03  ADDENDUM: Although mentioned in the body of the original report, the presence of mass-like thickening of the body of the stomach was not mentioned in the conclusion of the original report. This is best visualized on images 67-72 of series 10. The possibility of a primary gastric neoplasm should be considered, and correlation with endoscopy is suggested.  These results will be called to the ordering clinician or representative by the Radiologist Assistant, and communication documented in the PACS or zVision Dashboard.   Electronically Signed   By: Vinnie Langton M.D.   On: 04/26/2014 14:03   04/26/2014   CLINICAL DATA:  81 year old male with elevated liver function tests for the past 3 months. Abnormal abdominal ultrasound demonstrating multiple liver lesions. Evaluate for potential metastatic disease.  EXAM: CT ABDOMEN AND PELVIS WITHOUT AND WITH CONTRAST  TECHNIQUE: Multidetector CT imaging of the abdomen and pelvis was performed following the standard protocol before and following the bolus administration of intravenous contrast.  CONTRAST:  100 mL of Omnipaque 300.  COMPARISON:  Abdominal ultrasound 04/16/2014.  FINDINGS: Lower chest: There are multiple small pulmonary nodules in the lung bases bilaterally, largest of which is in the inferior aspect of the lingula measuring 8 mm (image 4 of series 12). Atherosclerotic calcifications in the left anterior  descending and right coronary arteries. Cardiomegaly, with right atrial and right ventricular dilatation.  Hepatobiliary: There are innumerable hypovascular lesions scattered throughout the hepatic parenchyma, compatible with widespread metastatic disease to the liver. Two of the largest lesions include a 6.4 x 5.5 cm lesion in segment 7 (image 38 of series 10), and a 4.7 x 4.1 cm lesion in segments 4A/4B (image 58 of series 10). No intra or extrahepatic biliary ductal dilatation. Gallbladder is nearly decompressed, but otherwise unremarkable in appearance. Portal vein branches are patent at this time.  Pancreas: No definite mass in the pancreas. No pancreatic ductal dilatation. No peripancreatic inflammatory changes or fluid collections.  Spleen: Unremarkable.  Adrenals/Urinary Tract: Bilateral adrenal glands are normal in appearance. 9 mm exophytic lesion in the medial aspect of the upper pole of the left kidney (image 20 of series 2, image 34 of series 6, and image 40 of series  10) is 58 HU on precontrast images, 63 HU on arterial phase images, 88 HU on portal venous phase images, and 47 HU on delayed images, concerning for a small enhancing neoplasm. Adjacent to this in the upper pole of the left kidney posteriorly there is a hyperdense nonenhancing lesion measuring 2.1 cm extending exophytically off the posterior aspect of the upper pole, compatible with a hyperdense cyst. 14 mm low-attenuation nonenhancing lesion in the posterior aspect of the interpolar region of the right kidney is compatible with a small simple cyst. No hydroureteronephrosis. Urinary bladder is normal in appearance. The kidneys enhance symmetrically and normally bilaterally.  Stomach/Bowel: Focal area of masslike thickening of the body of the stomach, best appreciated on images 67-72 of series 10. No pathologic dilatation of small bowel. Multiple loops of mid to distal small bowel extend into a large left inguinal hernia. Normal appendix.  Notably, the tip of the appendix descends into a small right inguinal hernia.  Vascular/Lymphatic: Extensive atherosclerosis throughout the abdominal and pelvic vasculature, with mild fusiform aneurysmal dilatation of the infrarenal abdominal aorta measuring up to 3.2 x 2.8 cm (image 70 of series 10). Left-sided retroperitoneal lymphadenopathy adjacent to the left renal hilum, with a heterogeneously enhancing lymph node measuring up to 4.2 x 3.9 cm (image 54 of series 10), which is intimately associated with both the left renal artery (widely patent) and the left renal vein, which appears either significantly stenotic or occluded by the mass. Multiple splenorenal collateral vessels. Additional low left para-aortic retroperitoneal lymph node measuring 14 mm in short axis (image 73 of series 10) is very low-attenuation.  Reproductive: Prostate gland and seminal vesicles are unremarkable in appearance.  Other: Small volume of ascites, much of which is in the inguinal hernias bilaterally. No pneumoperitoneum.  Musculoskeletal: There are no aggressive appearing lytic or blastic lesions noted in the visualized portions of the skeleton.  IMPRESSION: 1. Innumerable hepatic nodules and masses, compatible with widespread metastatic disease to the liver. In addition, there are numerous pulmonary nodules in the visualize lung bases, also concerning for metastatic disease. There is also retroperitoneal lymphadenopathy, with the largest retroperitoneal lymph node measuring 4.2 x 3.9 cm causing high-grade stenosis or complete obstruction of the left renal vein (numerous left-sided splenorenal collateral veins are present maintaining normal perfusion of the left kidney at this time). 2. 9 mm enhancing lesion in the upper pole of the left kidney, suspicious for a small renal neoplasm. Even if this lesion does prove to be neoplastic, this small lesion is unlikely to be the source of metastatic disease noted on today's examination.  Attention on followup studies is recommended. 3. Bilateral inguinal hernias (left greater than right). The left inguinal hernia contains multiple loops of mid to distal small bowel. No associated bowel incarceration or obstruction at this time. 4. Small volume of ascites. 5. Cardiomegaly with dilatation of the right atrium and right ventricle. 6. Atherosclerosis, including at least 2 vessel coronary artery disease. In addition, there is mild fusiform aneurysmal dilatation of the infrarenal abdominal aorta which measures up to 3.2 x 2.8 cm. 7. Additional incidental findings, as above. These results will be called to the ordering clinician or representative by the Radiologist Assistant, and communication documented in the PACS or zVision Dashboard.  Electronically Signed: By: Vinnie Langton M.D. On: 04/26/2014 10:38   Ct Chest Wo Contrast  05/13/2014   CLINICAL DATA:  Metastatic carcinoma  EXAM: CT CHEST WITHOUT CONTRAST  TECHNIQUE: Multidetector CT imaging of the chest was performed  following the standard protocol without IV contrast.  COMPARISON:  CT abdomen and pelvis from 04/26/2014  FINDINGS: Mediastinum: Heart size appears normal. There is calcified atherosclerotic plaque involving the thoracic aorta as well as the LAD and RCA Coronary arteries the trachea appears patent and is midline. Normal appearance of the esophagus.  Lungs/Pleura: Small left pleural effusion. Innumerable tiny nodules are scattered throughout both lungs. Index right upper lobe nodule measures 1.4 cm, image 32/series 5. Index nodule within the left upper lobe measures 1 cm, image 21/series 5. Index nodule in the left lower lobe measures 6 mm, image 34/series 5.  Upper Abdomen: Widespread liver metastasis again identified. Left upper quadrant mass within the left renal hilar region measures 4 cm. This is similar to previous exam.  Musculoskeletal: The bones appear diffusely osteopenic. Multi focal bone metastasis noted. Index lytic  lesion involving the T6 vertebra measures 1.6 cm, image 24/series 5. Index lytic lesion involving the posterior aspect of the left second rib is identified measuring 1.2 cm, image 10/series 5. Superior endplate fracture involving the T4 vertebra is identified underlying the lytic lesion is suspected, image 55 of series 603.  IMPRESSION: 1. Extensive bilateral pulmonary nodularity consistent with metastatic disease. 2. Multi focal lytic bone metastasis. At the T4 level there is a mild superior endplate compression fracture, suspicious for pathologic fracture. 3. Extensive upper abdominal metastasis as described on 04/26/2014. 4. Atherosclerotic disease including to vessel Coronary artery calcification.   Electronically Signed   By: Kerby Moors M.D.   On: 05/13/2014 10:51   US Abdomen Complete  04/16/2014   CLINICAL DATA:  Elevated liver enzymes history of pre diabetes ; hyperlipidemia.  EXAM: ULTRASOUND ABDOMEN COMPLETE  COMPARISON:  None.  FINDINGS: Gallbladder: The gallbladder is only partially distended. There is wall thickening to 4 mm. There is no pericholecystic fluid or positive sonographic Murphy's sign. No gallstones are evident.  Common bile duct: Diameter: 3 mm  Liver: The hepatic echotexture is heterogeneous. There are numerous hypoechoic nodules throughout the liver with the largest measuring 3.2 cm in greatest dimension. There is no intrahepatic ductal dilation.  IVC: No abnormality visualized.  Pancreas: Bowel gas largely obscures the pancreas.  Spleen: Size and appearance within normal limits.  Right Kidney: Length: 10.3 cm. There is a midpole simple appearing cyst in the right kidney measuring 1.7 cm in greatest dimension. There is no solid mass nor hydronephrosis.  Left Kidney: Length: 10.1 cm. There is an upper pole cyst in the left kidney measuring 2.3 cm in greatest dimension. There is no hydronephrosis nor solid mass.  Abdominal aorta: No aneurysm visualized.  Other findings: No ascites.   IMPRESSION: 1. Abnormal hepatic echotexture with multiple hypoechoic masses. The pattern is worrisome for metastatic disease. Hepatic protocol MRI or CT scan is recommended. 2. Contracted gallbladder without evidence of stones or positive sonographic Murphy's sign. This may reflect chronic cholecystitis. 3. Simple appearing cysts in both kidneys. These results will be called to the ordering clinician or representative by the Radiologist Assistant, and communication documented in the PACS or zVision Dashboard.   Electronically Signed   By: David  Martinique   On: 04/16/2014 09:08    Labs:  CBC:  Recent Labs  05/13/14 0930 05/14/14 1130  WBC 7.4 6.6  HGB 16.9 16.3  HCT 49.6 48.6  PLT 107* 102*    COAGS:  Recent Labs  05/13/14 0930 05/14/14 1130  INR 1.30* 1.07  APTT  --  29    BMP:  Recent Labs  05/10/14 1312 05/14/14 1130  NA 138 136  K 4.9 4.4  CL  --  102  CO2 26 23  GLUCOSE 111 108*  BUN 22.6 31*  CALCIUM 9.9 9.3  CREATININE 1.5* 1.48*  GFRNONAA  --  43*  GFRAA  --  49*    LIVER FUNCTION TESTS:  Recent Labs  05/14/14 1130  BILITOT 3.0*  AST 72*  ALT 53  ALKPHOS 266*  PROT 6.9  ALBUMIN 3.6    TUMOR MARKERS:  Recent Labs  05/10/14 1312  CEA 1.6    Assessment and Plan: Shannon Kirby is a 81 y.o. male with recently noted elevated liver function tests on routine physical exam with subsequent imaging revealing multiple liver lesions, numerous pulmonary nodules, retroperitoneal adenopathy, left upper pole renal lesion, ascites, multifocal lytic bone metastasis. He presents today for ultrasound-guided liver lesion biopsy.Risks and benefits discussed with the patient/son including, but not limited to bleeding, infection, damage to adjacent structures or low yield requiring additional tests. All of the patient's questions were answered, patient is agreeable to proceed. Consent signed and in chart.      Signed: Autumn Messing 05/14/2014, 12:14 PM   I  spent a total of 20 minutes face to face in clinical consultation, greater than 50% of which was counseling/coordinating care for ultrasound-guided liver lesion biopsy

## 2014-05-14 NOTE — Procedures (Signed)
US guided core biopsies of right hepatic lesions. 3 cores obtained.  Minimal blood loss.  No immediate complication.

## 2014-05-17 ENCOUNTER — Telehealth: Payer: Self-pay | Admitting: *Deleted

## 2014-05-17 ENCOUNTER — Telehealth: Payer: Self-pay | Admitting: Oncology

## 2014-05-17 ENCOUNTER — Other Ambulatory Visit: Payer: Self-pay | Admitting: Nurse Practitioner

## 2014-05-17 ENCOUNTER — Ambulatory Visit: Payer: Commercial Managed Care - HMO | Admitting: Nurse Practitioner

## 2014-05-17 NOTE — Telephone Encounter (Signed)
WILL LIFE EXPECTANCY BE ADDRESSED WITH PT. AT TODAY'S VISIT? VERBAL ORDER AND READ BACK TO DR.SHERRILL- ALL ISSUES DISCUSSED AT PT.'S VISIT WILL BE ADDRESSED IN A POSITIVE MATTER. NOTIFIED PT.'S DAUGHTER

## 2014-05-17 NOTE — Telephone Encounter (Signed)
-----   Message from Owens Shark, NP sent at 05/17/2014 11:12 AM EDT ----- Please let him know the biopsy result will not be available until Wednesday per Dr. Benay Spice. We will cancel today's appointment and reschedule for Wednesday at 1045.

## 2014-05-17 NOTE — Telephone Encounter (Signed)
s.w. pt wife and advised on todays appt cx and moved to 4.27.Marland KitchenMarland KitchenMarland Kitchenpt ok and aware

## 2014-05-17 NOTE — Telephone Encounter (Signed)
Called and informed patient that biopsy results will not be available until Wednesday.  Per Dr. Benay Spice.  Patient's daughter verbalized understanding.   Reminded patient's daughter of new appt time.

## 2014-05-19 ENCOUNTER — Ambulatory Visit (HOSPITAL_COMMUNITY): Payer: Commercial Managed Care - HMO

## 2014-05-19 ENCOUNTER — Ambulatory Visit (HOSPITAL_BASED_OUTPATIENT_CLINIC_OR_DEPARTMENT_OTHER): Payer: Commercial Managed Care - HMO | Admitting: Nurse Practitioner

## 2014-05-19 ENCOUNTER — Telehealth: Payer: Self-pay | Admitting: Nurse Practitioner

## 2014-05-19 VITALS — BP 95/58 | HR 94 | Temp 98.3°F | Resp 18 | Ht 75.0 in | Wt 136.0 lb

## 2014-05-19 DIAGNOSIS — C787 Secondary malignant neoplasm of liver and intrahepatic bile duct: Secondary | ICD-10-CM | POA: Diagnosis not present

## 2014-05-19 DIAGNOSIS — C499 Malignant neoplasm of connective and soft tissue, unspecified: Secondary | ICD-10-CM | POA: Insufficient documentation

## 2014-05-19 DIAGNOSIS — R918 Other nonspecific abnormal finding of lung field: Secondary | ICD-10-CM

## 2014-05-19 NOTE — [facility] Oncology Navigator Note (Signed)
Met briefly with patient and family after office visit. Feeling well, denies any home care needs. He has decided against any treatment and prefers comfort care. Encouraged them to call with any questions or concerns.

## 2014-05-19 NOTE — Telephone Encounter (Signed)
Pt confirmed labs/ov per 04/27 POF, gave pt AVS and Calendar..... KJ  °

## 2014-05-19 NOTE — Progress Notes (Addendum)
  [facility] OFFICE PROGRESS NOTE   Diagnosis:  Soft tissue sarcoma  INTERVAL HISTORY:   Mr. Mcewen returns as scheduled. He continues to feel well. He has a good appetite. He denies abdominal pain. No nausea or vomiting. Bowels moving regularly.  Objective:  Vital signs in last 24 hours:  Blood pressure 95/58, pulse 94, temperature 98.3 F (36.8 C), temperature source Oral, resp. rate 18, height 6\' 3"  (1.905 m), weight 136 lb (61.689 kg), SpO2 97 %.    HEENT: No thrush or ulcers. Resp: Lungs clear bilaterally. Cardio: Irregular. GI: Liver is enlarged, palpable throughout the right and left upper abdomen. Vascular: No leg edema. Neuro: Alert and oriented.  Musculoskeletal: No extremity mass.   Lab Results:  Lab Results  Component Value Date   WBC 6.6 05/14/2014   HGB 16.3 05/14/2014   HCT 48.6 05/14/2014   MCV 100.2* 05/14/2014   PLT 102* 05/14/2014   NEUTROABS 4.8 05/14/2014    Imaging:  No results found.  Medications: I have reviewed the patient's current medications.  Assessment/Plan: 1. 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  2. 9 mm hyperenhancing left renal lesion on the CT 04/26/2014  3. COPD  4. Atrial fibrillation  5. Prostatic hypertrophy  6. Bilateral inguinal hernias  7. Hypertension  8. 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 Dr. Benay Spice reviewed the pathology findings with Mr. Lubeck and his family. They understand that no therapy will be curative and surgery is not an option. Dr. Benay Spice discussed chemotherapy versus supportive care. Mr.  Mees would like to be followed on a supportive care approach. He lives with his daughter. At this time his family does not feel they need extra help at home.   We will schedule a return visit in 4-6 weeks. They understand they can contact the office prior to that visit should any needs/problems arise.  Patient seen with Dr. Benay Spice.   Ned Card ANP/GNP-BC   05/19/2014  11:20 AM  This was a shared visit with Ned Card. Mr. Hughlett was interviewed and examined.  He has been diagnosed with a metastatic spindle cell neoplasm. The pathology is most consistent with a soft tissue sarcoma, specifically a sarcoma of muscle origin. I discussed the pathology with Mr. Jakob and his family. No therapy will be curative. There is no apparent primary tumor site, though the retroperitoneal "nodal mass "could represent a primary tumor site. His case was presented at the GI tumor conference earlier today.  We discussed supportive care and systemic chemotherapy. Mr. Genova does not wish to receive chemotherapy. He is asymptomatic at present. The plan is to follow him with observation. We will add supportive care measures and consider Hospice care as indicated.  Julieanne Manson, M.D.

## 2014-06-04 ENCOUNTER — Telehealth: Payer: Self-pay | Admitting: *Deleted

## 2014-06-04 NOTE — Telephone Encounter (Signed)
Called to report his abdomen continues to enlarge and over past 3 days she has noted a marble sized "knot" just above his umbilicus. He is in no pain and does not want any treatment for his cancer. She just wanted MD aware to be sure OK to wait till June 7th appointment. Informed her as long as he is comfortable and there is no skin breakdown, OK to wait till June 7th visit.

## 2014-06-07 ENCOUNTER — Telehealth: Payer: Self-pay | Admitting: *Deleted

## 2014-06-07 NOTE — Telephone Encounter (Signed)
More "lumps" coming up on his abdomen and arms. Thinks he is dehydrated even though he is up frequently to void-small amounts. Requesting to talk with Dr. Gearldine Shown nurse. Forwarded to Production designer, theatre/television/film.

## 2014-06-07 NOTE — Telephone Encounter (Signed)
Returned call to Shannon Kirby, she reports pt has a new lump on his arm. Denies any pain, does not interfere with function. She reports pt's bladder habits are back to his usual. Had not been urinating much over the weekend.  She will call the office as needed. Declined need for office visit at this time. Pt will follow up as scheduled in June.

## 2014-06-17 ENCOUNTER — Emergency Department (HOSPITAL_COMMUNITY): Payer: Commercial Managed Care - HMO

## 2014-06-17 ENCOUNTER — Inpatient Hospital Stay (HOSPITAL_COMMUNITY)
Admission: EM | Admit: 2014-06-17 | Discharge: 2014-06-22 | DRG: 689 | Disposition: A | Discharge status: Skilled Nursing Facility | Payer: Commercial Managed Care - HMO | Attending: Internal Medicine | Admitting: Internal Medicine

## 2014-06-17 ENCOUNTER — Encounter (HOSPITAL_COMMUNITY): Payer: Self-pay | Admitting: *Deleted

## 2014-06-17 DIAGNOSIS — R17 Unspecified jaundice: Secondary | ICD-10-CM | POA: Diagnosis present

## 2014-06-17 DIAGNOSIS — R609 Edema, unspecified: Secondary | ICD-10-CM

## 2014-06-17 DIAGNOSIS — Z79899 Other long term (current) drug therapy: Secondary | ICD-10-CM

## 2014-06-17 DIAGNOSIS — J9601 Acute respiratory failure with hypoxia: Secondary | ICD-10-CM | POA: Diagnosis present

## 2014-06-17 DIAGNOSIS — L89222 Pressure ulcer of left hip, stage 2: Secondary | ICD-10-CM | POA: Diagnosis present

## 2014-06-17 DIAGNOSIS — E860 Dehydration: Secondary | ICD-10-CM | POA: Diagnosis present

## 2014-06-17 DIAGNOSIS — R1310 Dysphagia, unspecified: Secondary | ICD-10-CM | POA: Diagnosis present

## 2014-06-17 DIAGNOSIS — M7989 Other specified soft tissue disorders: Secondary | ICD-10-CM | POA: Diagnosis present

## 2014-06-17 DIAGNOSIS — N400 Enlarged prostate without lower urinary tract symptoms: Secondary | ICD-10-CM | POA: Diagnosis present

## 2014-06-17 DIAGNOSIS — R197 Diarrhea, unspecified: Secondary | ICD-10-CM | POA: Diagnosis present

## 2014-06-17 DIAGNOSIS — C787 Secondary malignant neoplasm of liver and intrahepatic bile duct: Secondary | ICD-10-CM | POA: Diagnosis present

## 2014-06-17 DIAGNOSIS — L8990 Pressure ulcer of unspecified site, unspecified stage: Secondary | ICD-10-CM | POA: Insufficient documentation

## 2014-06-17 DIAGNOSIS — Z66 Do not resuscitate: Secondary | ICD-10-CM | POA: Diagnosis present

## 2014-06-17 DIAGNOSIS — Z681 Body mass index (BMI) 19 or less, adult: Secondary | ICD-10-CM

## 2014-06-17 DIAGNOSIS — R532 Functional quadriplegia: Secondary | ICD-10-CM | POA: Diagnosis present

## 2014-06-17 DIAGNOSIS — E785 Hyperlipidemia, unspecified: Secondary | ICD-10-CM | POA: Diagnosis present

## 2014-06-17 DIAGNOSIS — Z7901 Long term (current) use of anticoagulants: Secondary | ICD-10-CM

## 2014-06-17 DIAGNOSIS — E43 Unspecified severe protein-calorie malnutrition: Secondary | ICD-10-CM | POA: Diagnosis present

## 2014-06-17 DIAGNOSIS — J690 Pneumonitis due to inhalation of food and vomit: Secondary | ICD-10-CM | POA: Diagnosis present

## 2014-06-17 DIAGNOSIS — F419 Anxiety disorder, unspecified: Secondary | ICD-10-CM | POA: Diagnosis present

## 2014-06-17 DIAGNOSIS — Z7401 Bed confinement status: Secondary | ICD-10-CM

## 2014-06-17 DIAGNOSIS — J181 Lobar pneumonia, unspecified organism: Secondary | ICD-10-CM | POA: Diagnosis not present

## 2014-06-17 DIAGNOSIS — R531 Weakness: Secondary | ICD-10-CM | POA: Diagnosis present

## 2014-06-17 DIAGNOSIS — L8992 Pressure ulcer of unspecified site, stage 2: Secondary | ICD-10-CM | POA: Diagnosis not present

## 2014-06-17 DIAGNOSIS — N390 Urinary tract infection, site not specified: Principal | ICD-10-CM | POA: Diagnosis present

## 2014-06-17 DIAGNOSIS — Z8249 Family history of ischemic heart disease and other diseases of the circulatory system: Secondary | ICD-10-CM | POA: Diagnosis not present

## 2014-06-17 DIAGNOSIS — F17210 Nicotine dependence, cigarettes, uncomplicated: Secondary | ICD-10-CM | POA: Diagnosis present

## 2014-06-17 DIAGNOSIS — I482 Chronic atrial fibrillation: Secondary | ICD-10-CM | POA: Diagnosis present

## 2014-06-17 DIAGNOSIS — C499 Malignant neoplasm of connective and soft tissue, unspecified: Secondary | ICD-10-CM | POA: Diagnosis present

## 2014-06-17 DIAGNOSIS — Z8673 Personal history of transient ischemic attack (TIA), and cerebral infarction without residual deficits: Secondary | ICD-10-CM | POA: Diagnosis not present

## 2014-06-17 DIAGNOSIS — I4510 Unspecified right bundle-branch block: Secondary | ICD-10-CM | POA: Diagnosis present

## 2014-06-17 DIAGNOSIS — D6959 Other secondary thrombocytopenia: Secondary | ICD-10-CM | POA: Diagnosis present

## 2014-06-17 DIAGNOSIS — C801 Malignant (primary) neoplasm, unspecified: Secondary | ICD-10-CM

## 2014-06-17 DIAGNOSIS — Z800 Family history of malignant neoplasm of digestive organs: Secondary | ICD-10-CM

## 2014-06-17 DIAGNOSIS — I10 Essential (primary) hypertension: Secondary | ICD-10-CM | POA: Diagnosis present

## 2014-06-17 DIAGNOSIS — C7800 Secondary malignant neoplasm of unspecified lung: Secondary | ICD-10-CM | POA: Diagnosis present

## 2014-06-17 DIAGNOSIS — R627 Adult failure to thrive: Secondary | ICD-10-CM | POA: Diagnosis present

## 2014-06-17 DIAGNOSIS — Z515 Encounter for palliative care: Secondary | ICD-10-CM | POA: Diagnosis not present

## 2014-06-17 DIAGNOSIS — N3000 Acute cystitis without hematuria: Secondary | ICD-10-CM

## 2014-06-17 HISTORY — DX: Malignant (primary) neoplasm, unspecified: C80.1

## 2014-06-17 LAB — COMPREHENSIVE METABOLIC PANEL
ALK PHOS: 324 U/L — AB (ref 38–126)
ALT: 52 U/L (ref 17–63)
AST: 84 U/L — ABNORMAL HIGH (ref 15–41)
Albumin: 2.7 g/dL — ABNORMAL LOW (ref 3.5–5.0)
Anion gap: 11 (ref 5–15)
BILIRUBIN TOTAL: 7.4 mg/dL — AB (ref 0.3–1.2)
BUN: 27 mg/dL — AB (ref 6–20)
CHLORIDE: 100 mmol/L — AB (ref 101–111)
CO2: 25 mmol/L (ref 22–32)
Calcium: 8.8 mg/dL — ABNORMAL LOW (ref 8.9–10.3)
Creatinine, Ser: 1.18 mg/dL (ref 0.61–1.24)
GFR calc Af Amer: >60 mL/min (ref >60)
GFR calc non Af Amer: 56 mL/min — ABNORMAL LOW (ref >60)
GLUCOSE: 133 mg/dL — AB (ref 65–99)
Potassium: 4.3 mmol/L (ref 3.5–5.1)
SODIUM: 136 mmol/L (ref 135–145)
Total Protein: 6.3 g/dL — ABNORMAL LOW (ref 6.5–8.1)

## 2014-06-17 LAB — CBC WITH DIFFERENTIAL/PLATELET
Basophils Absolute: 0 10*3/uL (ref 0.0–0.1)
Basophils Relative: 0 % (ref 0–1)
EOS ABS: 0 10*3/uL (ref 0.0–0.7)
EOS PCT: 0 % (ref 0–5)
HCT: 46.8 % (ref 39.0–52.0)
HEMOGLOBIN: 16.5 g/dL (ref 13.0–17.0)
LYMPHS ABS: 0.6 10*3/uL — AB (ref 0.7–4.0)
Lymphocytes Relative: 7 % — ABNORMAL LOW (ref 12–46)
MCH: 34.4 pg — ABNORMAL HIGH (ref 26.0–34.0)
MCHC: 35.3 g/dL (ref 30.0–36.0)
MCV: 97.5 fL (ref 78.0–100.0)
Monocytes Absolute: 1.2 10*3/uL — ABNORMAL HIGH (ref 0.1–1.0)
Monocytes Relative: 14 % — ABNORMAL HIGH (ref 3–12)
NEUTROS PCT: 78 % — AB (ref 43–77)
Neutro Abs: 6.2 10*3/uL (ref 1.7–7.7)
PLATELETS: 97 10*3/uL — AB (ref 150–400)
RBC: 4.8 MIL/uL (ref 4.22–5.81)
RDW: 17.1 % — ABNORMAL HIGH (ref 11.5–15.5)
WBC: 8 10*3/uL (ref 4.0–10.5)

## 2014-06-17 LAB — URINALYSIS, ROUTINE W REFLEX MICROSCOPIC
Glucose, UA: NEGATIVE mg/dL
Hgb urine dipstick: NEGATIVE
Ketones, ur: NEGATIVE mg/dL
Nitrite: POSITIVE — AB
PH: 5.5 (ref 5.0–8.0)
PROTEIN: NEGATIVE mg/dL
Specific Gravity, Urine: 1.028 (ref 1.005–1.030)
Urobilinogen, UA: 2 mg/dL — ABNORMAL HIGH (ref 0.0–1.0)

## 2014-06-17 LAB — BRAIN NATRIURETIC PEPTIDE: B Natriuretic Peptide: 269.2 pg/mL — ABNORMAL HIGH (ref 0.0–100.0)

## 2014-06-17 LAB — URINE MICROSCOPIC-ADD ON

## 2014-06-17 LAB — TROPONIN I

## 2014-06-17 MED ORDER — RIVAROXABAN 20 MG PO TABS: 20.0000 mg | ORAL_TABLET | Freq: Every day | ORAL | Status: DC

## 2014-06-17 MED ORDER — SODIUM CHLORIDE 0.9 % IV SOLN
INTRAVENOUS | Status: AC
  Administered 2014-06-17: 19:00:00 via INTRAVENOUS

## 2014-06-17 MED ORDER — CHLORHEXIDINE GLUCONATE 0.12 % MT SOLN
15.0000 mL | Freq: Two times a day (BID) | OROMUCOSAL | Status: DC
  Administered 2014-06-17 – 2014-06-22 (×10): 15 mL via OROMUCOSAL
  Filled 2014-06-17 (×12): qty 15

## 2014-06-17 MED ORDER — LORAZEPAM 2 MG/ML IJ SOLN
0.5000 mg | Freq: Four times a day (QID) | INTRAMUSCULAR | Status: DC | PRN
  Administered 2014-06-17 – 2014-06-22 (×4): 0.5 mg via INTRAVENOUS
  Filled 2014-06-17 (×4): qty 1

## 2014-06-17 MED ORDER — NICOTINE 21 MG/24HR TD PT24
21.0000 mg | MEDICATED_PATCH | Freq: Every day | TRANSDERMAL | Status: DC
  Administered 2014-06-17 – 2014-06-22 (×6): 21 mg via TRANSDERMAL
  Filled 2014-06-17 (×6): qty 1

## 2014-06-17 MED ORDER — CETYLPYRIDINIUM CHLORIDE 0.05 % MT LIQD
7.0000 mL | Freq: Two times a day (BID) | OROMUCOSAL | Status: DC
  Administered 2014-06-18 – 2014-06-21 (×6): 7 mL via OROMUCOSAL

## 2014-06-17 MED ORDER — SODIUM CHLORIDE 0.9 % IJ SOLN
3.0000 mL | Freq: Two times a day (BID) | INTRAMUSCULAR | Status: DC
  Administered 2014-06-19 – 2014-06-21 (×5): 3 mL via INTRAVENOUS

## 2014-06-17 MED ORDER — ENOXAPARIN SODIUM 80 MG/0.8ML ~~LOC~~ SOLN
65.0000 mg | Freq: Two times a day (BID) | SUBCUTANEOUS | Status: DC
  Administered 2014-06-17 – 2014-06-21 (×8): 65 mg via SUBCUTANEOUS
  Filled 2014-06-17 (×10): qty 0.8

## 2014-06-17 MED ORDER — CEFTRIAXONE SODIUM IN DEXTROSE 20 MG/ML IV SOLN
1.0000 g | INTRAVENOUS | Status: DC
  Filled 2014-06-17: qty 50

## 2014-06-17 MED ORDER — DEXTROSE 5 % IV SOLN
1.0000 g | Freq: Once | INTRAVENOUS | Status: AC
  Administered 2014-06-17: 1 g via INTRAVENOUS
  Filled 2014-06-17: qty 10

## 2014-06-17 NOTE — ED Notes (Signed)
Patient very weak, unable to change positions or stand at the bedside independently. Patient require 2 person hands on assistance and was only able to take 6 steps before legs began to buckle.

## 2014-06-17 NOTE — ED Notes (Addendum)
Patient was sent from home with family with multiple complaints. The family states that they are concerned about the swelling to bilateral lower extremities. Family also reported that the patient had difficulty swallowing at meal time last night and could not drink his ensure this morning. The patient is alert and oriented and denies all complaints from the family. Patient says that his feet have been swollen for some time and that he has no idea why his family called EMS.

## 2014-06-17 NOTE — ED Notes (Signed)
Pt. Is unable to use the restroom at this time, but is aware that we need a urine specimen.  

## 2014-06-17 NOTE — ED Provider Notes (Signed)
CSN: [id number]     Arrival date & time 06/17/14  0747 History   First MD Initiated Contact with Patient 06/17/14 [phone]     Chief Complaint  Patient presents with  . Leg Swelling  . Dysphagia     (Consider location/radiation/quality/duration/timing/severity/associated sxs/prior Treatment) Patient is a 81 y.o. male presenting with general illness.  Illness Quality:  Generalized weakness, decreased mobility, bilateral leg swelling. 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  Relieved by:  Nothing Associated symptoms: no abdominal pain, no chest pain, no fever, no nausea, no shortness of breath and no vomiting     Past Medical History  Diagnosis Date  . Hypertension   . Hernia     L groin   . Hyperlipidemia   . Pre-diabetes   . A-fib   . Stroke     X2  . Urinary retention   . Protein calorie malnutrition   . GI bleed June 2014  . BPH (benign prostatic hyperplasia)   . COPD (chronic obstructive pulmonary disease)   . Anxiety disorder   . Dysrhythmia     afib with stroke  . Cancer    Past Surgical History  Procedure Laterality Date  . No past surgeries    . Tonsillectomy and adenoidectomy  age 50  . Colonoscopy N/A 07/13/2012    Procedure: COLONOSCOPY;  Surgeon: Lear Ng, MD;  Location: [facility] ENDOSCOPY;  Service: Endoscopy;  Laterality: N/A;   Family History  Problem Relation Age of Onset  . Other Mother     Passed at 81 of "blood clot in chest"  . Colon cancer Father     Passed at 60  . Heart attack Sister     Passed away 3  . Dementia Sister    History  Substance Use Topics  . Smoking status: Current Every Day Smoker -- 1.00 packs/day for 63 years  . Smokeless tobacco: Never Used     Comment: Smoked since age 89  . Alcohol Use: No    Review of Systems  Constitutional: Negative for fever.  Respiratory:  Negative for shortness of breath.   Cardiovascular: Negative for chest pain.  Gastrointestinal: Negative for nausea, vomiting and abdominal pain.  All other systems reviewed and are negative.     Allergies  Review of patient's allergies indicates no known allergies.  Home Medications   Prior to Admission medications   Medication Sig Start Date End Date Taking? Authorizing Provider  ALPRAZolam Duanne Moron) 1 MG tablet Take 0.5-1 mg by mouth 4 (four) times daily -  with meals and at bedtime. Take half of a table three times a day and a whole tablet at bedtime.    Historical Provider, MD  diltiazem (CARDIZEM CD) 120 MG 24 hr capsule Take 1 capsule (120 mg total) by mouth daily. 07/01/12   Bonnielee Haff, MD  escitalopram (LEXAPRO) 20 MG tablet Take 20 mg by mouth daily with lunch.  05/03/14   Historical Provider, MD  feeding supplement (ENSURE COMPLETE) LIQD Take 237 mLs by mouth 2 (two) times daily between meals. 07/14/12   Barton Dubois, MD  finasteride (PROSCAR) 5 MG tablet Take 5 mg by mouth daily with supper.  07/08/12   Historical Provider, MD  polyethylene glycol (MIRALAX / GLYCOLAX) packet Take 17 g by mouth daily. Patient taking differently: Take 17 g by mouth daily as needed for  mild constipation.  07/14/12   Barton Dubois, MD  pravastatin (PRAVACHOL) 10 MG tablet Take 10 mg by mouth every evening.    Historical Provider, MD  Rivaroxaban (XARELTO) 20 MG TABS tablet Take 20 mg by mouth daily at 12 noon.     Historical Provider, MD  tamsulosin (FLOMAX) 0.4 MG CAPS capsule Take 0.4 mg by mouth every morning.  05/01/14   Historical Provider, MD   BP 110/65 mmHg  Pulse 97  Temp(Src) 98.2 F (36.8 C) (Oral)  Resp 21  SpO2 94% Physical Exam  Constitutional: He is oriented to person, place, and time. He appears well-developed and well-nourished. No distress.  HENT:  Head: Normocephalic and atraumatic.  Mouth/Throat: Oropharynx is clear and moist.  Eyes: Conjunctivae are normal. Pupils are  equal, round, and reactive to light. No scleral icterus.  Neck: Neck supple.  Cardiovascular: Normal rate, regular rhythm, normal heart sounds and intact distal pulses.   No murmur heard. Pulmonary/Chest: Effort normal and breath sounds normal. No stridor. No respiratory distress. He has no wheezes. He has no rales.  Abdominal: Soft. He exhibits no distension. There is no tenderness.  Musculoskeletal: Normal range of motion. He exhibits edema (1+ pitting edema in RLE from ankle down.  2+ pitting edema in LLE from ankle down.).  Neurological: He is alert and oriented to person, place, and time.  Skin: Skin is warm and dry. No rash noted.  Psychiatric: He has a normal mood and affect. His behavior is normal.  Nursing note and vitals reviewed.   ED Course  Procedures (including critical care time) Labs Review Labs Reviewed  CBC WITH DIFFERENTIAL/PLATELET - Abnormal; Notable for the following:    MCH 34.4 (*)    RDW 17.1 (*)    Platelets 97 (*)    Neutrophils Relative % 78 (*)    Lymphocytes Relative 7 (*)    Lymphs Abs 0.6 (*)    Monocytes Relative 14 (*)    Monocytes Absolute 1.2 (*)    All other components within normal limits  COMPREHENSIVE METABOLIC PANEL - Abnormal; Notable for the following:    Chloride 100 (*)    Glucose, Bld 133 (*)    BUN 27 (*)    Calcium 8.8 (*)    Total Protein 6.3 (*)    Albumin 2.7 (*)    AST 84 (*)    Alkaline Phosphatase 324 (*)    Total Bilirubin 7.4 (*)    GFR calc non Af Amer 56 (*)    All other components within normal limits  URINALYSIS, ROUTINE W REFLEX MICROSCOPIC (NOT AT [facility]) - Abnormal; Notable for the following:    Color, Urine ORANGE (*)    Bilirubin Urine LARGE (*)    Urobilinogen, UA 2.0 (*)    Nitrite POSITIVE (*)    Leukocytes, UA SMALL (*)    All other components within normal limits  BRAIN NATRIURETIC PEPTIDE - Abnormal; Notable for the following:    B Natriuretic Peptide 269.2 (*)    All other components within normal  limits  URINE MICROSCOPIC-ADD ON - Abnormal; Notable for the following:    Bacteria, UA FEW (*)    Crystals CA OXALATE CRYSTALS (*)    All other components within normal limits  URINE CULTURE  TROPONIN I    Imaging Review Dg Chest Port 1 View  06/17/2014   CLINICAL DATA:  Generalized weakness and soft tissue swelling. History of soft tissue sarcoma.  EXAM: PORTABLE CHEST - 1 VIEW  COMPARISON:  Chest CT May 13, 2014; chest radiograph January 04, 2013  FINDINGS: There is airspace consolidation in the left lower lobe. Multiple pulmonary nodular lesions consistent with metastatic foci are noted, better seen on recent CT. Heart is upper normal in size with pulmonary vascularity within normal limits. There is no appreciable adenopathy. There are scattered lytic bone lesions, better seen on recent CT.  IMPRESSION: Left lower lobe airspace consolidation. Evidence of metastatic disease with multiple pulmonary nodular lesions and bony metastases. These lesions are better seen on CT than radiography, particularly the bony metastatic lesions.   Electronically Signed   By: Lowella Grip III M.D.   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  On xarelto for a fib and reports compliance.  So, very unlikely he has DVT.  CXR shows mets and LLL consolidation.  No fevers or cough to suggest PNA.  UA positive for nitrites, leuks.  Certainly could be  cause of acute on chronic weakness.  He is unable to stand, requiring three staff members to support him.  Therefore, he needs treatment for UTI and admission.      Serita Grit, MD 06/17/14 [phone]

## 2014-06-17 NOTE — H&P (Signed)
History and Physical  [name] TSV:[id number] DOB: [birth year] DOA: 06/17/2014  Referring physician: EDP PCP: [name], MD   Chief Complaint: progressive weakness, difficulty swallowing  HPI: Shannon Kirby is a 81 y.o. male  With metastatic soft tissue sarcoma declined chemotherapy, electively to receive supportive care was brought to ER by family due to progressive weakness. Per family, patient has been largely bedbound in the past few months, but was able to ambulate a few steps with a walker in the past. However, the last few days, he was not able to ambulate, also developed difficulty swallowing, patient denies pain, no fever, no n/v.  He was found to have uti in the ER, he was given rocephin and hospitalist called for admission.   Review of Systems:  Detail per HPI, Review of systems are otherwise negative  Past Medical History  Diagnosis Date  . Hypertension   . Hernia     L groin   . Hyperlipidemia   . Pre-diabetes   . A-fib   . Stroke     X2  . Urinary retention   . Protein calorie malnutrition   . GI bleed June 2014  . BPH (benign prostatic hyperplasia)   . COPD (chronic obstructive pulmonary disease)   . Anxiety disorder   . Dysrhythmia     afib with stroke  . Cancer    Past Surgical History  Procedure Laterality Date  . No past surgeries    . Tonsillectomy and adenoidectomy  age 4  . Colonoscopy N/A 07/13/2012    Procedure: COLONOSCOPY;  Surgeon: Lear Ng, MD;  Location[facility] ENDOSCOPY;  Service: Endoscopy;  Laterality: N/A;   Social History:  reports that he has been smoking.  He has never used smokeless tobacco. He reports that he does not drink alcohol or use illicit drugs. Patient lives at home & is mostly dependent on ADL's  No Known Allergies  Family History  Problem Relation Age of Onset  . Other Mother     Passed at 82 of "blood clot in chest"  . Colon cancer Father     Passed at 10  . Heart attack Sister     Passed away 45  .  Dementia Sister       Prior to Admission medications   Medication Sig Start Date End Date Taking? Authorizing Provider  ALPRAZolam Duanne Moron) 1 MG tablet Take 0.5-1 mg by mouth 4 (four) times daily -  with meals and at bedtime. Take half of a table three times a day and a whole tablet at bedtime.   Yes Historical Provider, MD  diltiazem (CARDIZEM CD) 120 MG 24 hr capsule Take 1 capsule (120 mg total) by mouth daily. 07/01/12  Yes Bonnielee Haff, MD  escitalopram (LEXAPRO) 20 MG tablet Take 20 mg by mouth daily with lunch.  05/03/14  Yes Historical Provider, MD  feeding supplement (ENSURE COMPLETE) LIQD Take 237 mLs by mouth 2 (two) times daily between meals. 07/14/12  Yes Barton Dubois, MD  finasteride (PROSCAR) 5 MG tablet Take 5 mg by mouth daily with supper.  07/08/12  Yes Historical Provider, MD  polyethylene glycol (MIRALAX / GLYCOLAX) packet Take 17 g by mouth daily. Patient taking differently: Take 17 g by mouth daily as needed for mild constipation.  07/14/12  Yes Barton Dubois, MD  pravastatin (PRAVACHOL) 10 MG tablet Take 10 mg by mouth every evening.   Yes Historical Provider, MD  Rivaroxaban (XARELTO) 20 MG TABS tablet Take 20 mg by  mouth daily at 12 noon.    Yes Historical Provider, MD  tamsulosin (FLOMAX) 0.4 MG CAPS capsule Take 0.4 mg by mouth every morning.  05/01/14  Yes Historical Provider, MD    Physical Exam: BP 111/67 mmHg  Pulse 83  Temp(Src) 98.5 F (36.9 C) (Oral)  Resp 20  Ht 6\' 3"  (1.905 m)  Wt 67.9 kg (149 lb 11.1 oz)  BMI 18.71 kg/m2  SpO2 93%  General:  Frail, but does not seem in acute distress, jaudice Eyes: PERRL ENT: unremarkable Neck: supple, no JVD Cardiovascular: IRRR Respiratory: CTABL Abdomen: soft/ND/ND, positive bowel sounds Skin: no rash, jaudice Musculoskeletal:  Bilateral pedal edema Psychiatric: calm/cooperative Neurologic: aaox3          Labs on Admission:  Basic Metabolic Panel:  Recent Labs Lab 06/17/14 0857  NA 136  K 4.3  CL  100*  CO2 25  GLUCOSE 133*  BUN 27*  CREATININE 1.18  CALCIUM 8.8*   Liver Function Tests:  Recent Labs Lab 06/17/14 0857  AST 84*  ALT 52  ALKPHOS 324*  BILITOT 7.4*  PROT 6.3*  ALBUMIN 2.7*   No results for input(s): LIPASE, AMYLASE in the last 168 hours. No results for input(s): AMMONIA in the last 168 hours. CBC:  Recent Labs Lab 06/17/14 0857  WBC 8.0  NEUTROABS 6.2  HGB 16.5  HCT 46.8  MCV 97.5  PLT 97*   Cardiac Enzymes:  Recent Labs Lab 06/17/14 0857  TROPONINI <0.03    BNP (last 3 results)  Recent Labs  06/17/14 0857  BNP 269.2*    ProBNP (last 3 results) No results for input(s): PROBNP in the last 8760 hours.  CBG: No results for input(s): GLUCAP in the last 168 hours.  Radiological Exams on Admission: Dg Chest Port 1 View  06/17/2014   CLINICAL DATA:  Generalized weakness and soft tissue swelling. History of soft tissue sarcoma.  EXAM: PORTABLE CHEST - 1 VIEW  COMPARISON:  Chest CT May 13, 2014; chest radiograph January 04, 2013  FINDINGS: There is airspace consolidation in the left lower lobe. Multiple pulmonary nodular lesions consistent with metastatic foci are noted, better seen on recent CT. Heart is upper normal in size with pulmonary vascularity within normal limits. There is no appreciable adenopathy. There are scattered lytic bone lesions, better seen on recent CT.  IMPRESSION: Left lower lobe airspace consolidation. Evidence of metastatic disease with multiple pulmonary nodular lesions and bony metastases. These lesions are better seen on CT than radiography, particularly the bony metastatic lesions.   Electronically Signed   By: Lowella Grip III M.D.   On: 06/17/2014 08:57    EKG: chronic afib, no acute st/t changes  Assessment/Plan Present on Admission:  . UTI (urinary tract infection) . FTT (failure to thrive) in adult  Uti: culture pending, continue rocephin.   Dysphagia: npo for now, on aspiration precaution,  swallow eval pending. Gentle hydration.  Chronic afib, rate controlled continue home meds and xarelto.  Bilateral pedal edema, less likely dvt, continue xarelto. Possible from low albumin third spacing, monitor volume status.  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   Consultants: palliative care  Code Status: full  Family Communication:  Patient and multiple family members  Disposition Plan: admit to tele  Time spent: >70mins, more than 50% time spent on talking to family and coordination of care.  Shalom Mcguiness MD, PhD [facility] Pager [phone] If 7PM-7AM,  please contact night-coverage at [email or web address], password [facility]

## 2014-06-17 NOTE — ED Notes (Signed)
Family at bedside. 

## 2014-06-17 NOTE — ED Notes (Signed)
Bed: UQ33 Expected date:  Expected time:  Means of arrival:  Comments: EMS- trouble swallowing

## 2014-06-17 NOTE — Progress Notes (Signed)
ANTICOAGULATION CONSULT NOTE - Initial Consult  Pharmacy Consult for Lovenox Indication: atrial fibrillation  No Known Allergies  Patient Measurements: Height: 6\' 3"  (190.5 cm) Weight: 149 lb 11.1 oz (67.9 kg) IBW/kg (Calculated) : 84.5  Vital Signs: Temp: 98.5 F (36.9 C) (05/26 1621) Temp Source: Oral (05/26 1621) BP: 111/67 mmHg (05/26 1621) Pulse Rate: 83 (05/26 1621)  Labs:  Recent Labs  06/17/14 0857  HGB 16.5  HCT 46.8  PLT 97*  CREATININE 1.18  TROPONINI <0.03    Estimated Creatinine Clearance: 47.2 mL/min (by C-G formula based on Cr of 1.18).   Medical History: Past Medical History  Diagnosis Date  . Hypertension   . Hernia     L groin   . Hyperlipidemia   . Pre-diabetes   . A-fib   . Stroke     X2  . Urinary retention   . Protein calorie malnutrition   . GI bleed June 2014  . BPH (benign prostatic hyperplasia)   . COPD (chronic obstructive pulmonary disease)   . Anxiety disorder   . Dysrhythmia     afib with stroke  . Cancer     Medications:  Scheduled:  . antiseptic oral rinse  7 mL Mouth Rinse q12n4p  . [START ON 06/18/2014] cefTRIAXone (ROCEPHIN)  IV  1 g Intravenous Q24H  . chlorhexidine  15 mL Mouth Rinse BID  . nicotine  21 mg Transdermal Daily  . sodium chloride  3 mL Intravenous Q12H   Infusions:  . sodium chloride 75 mL/hr at 06/17/14 1852   PRN: LORazepam  Assessment: 81 yo male with metastatic soft tissue sarcoma who has declined chemotherapy, electived to receive supportive care, was brought to ER by family due to progressive weakness. On xarelto 20mg  daily for chronic afib which has been held on admission due to NPO status. Therefore, Pharmacy has been consulted to dose lovenox while off xarelto.   Weight: 67.9 kg  SCr 1.18, CrCl 47 ml/min  Hgb 16.5, Plts 97k - consistent with previous values  No bleeding reported  Goal of Therapy:  Anti-Xa level 0.6-1 units/ml 4hrs after LMWH dose given Monitor platelets by  anticoagulation protocol: Yes   Plan:   Lovenox 65mg  SQ q12h  Monitor renal function and adjust if needed  Follow CBC, signs/symptoms of bleeding  F/u ability to resume xarelto when appropriate  Peggyann Juba, PharmD, BCPS Pager: [phone] 06/17/2014,8:45 PM

## 2014-06-18 ENCOUNTER — Telehealth: Payer: Self-pay | Admitting: Oncology

## 2014-06-18 DIAGNOSIS — N390 Urinary tract infection, site not specified: Principal | ICD-10-CM

## 2014-06-18 DIAGNOSIS — J690 Pneumonitis due to inhalation of food and vomit: Secondary | ICD-10-CM

## 2014-06-18 DIAGNOSIS — R532 Functional quadriplegia: Secondary | ICD-10-CM

## 2014-06-18 DIAGNOSIS — R627 Adult failure to thrive: Secondary | ICD-10-CM

## 2014-06-18 DIAGNOSIS — Z515 Encounter for palliative care: Secondary | ICD-10-CM | POA: Insufficient documentation

## 2014-06-18 LAB — CBC
HCT: 44.6 % (ref 39.0–52.0)
HEMOGLOBIN: 15.6 g/dL (ref 13.0–17.0)
MCH: 34.1 pg — ABNORMAL HIGH (ref 26.0–34.0)
MCHC: 35 g/dL (ref 30.0–36.0)
MCV: 97.6 fL (ref 78.0–100.0)
Platelets: 118 10*3/uL — ABNORMAL LOW (ref 150–400)
RBC: 4.57 MIL/uL (ref 4.22–5.81)
RDW: 17.3 % — AB (ref 11.5–15.5)
WBC: 8.1 10*3/uL (ref 4.0–10.5)

## 2014-06-18 LAB — COMPREHENSIVE METABOLIC PANEL
ALBUMIN: 2.5 g/dL — AB (ref 3.5–5.0)
ALT: 42 U/L (ref 17–63)
AST: 69 U/L — AB (ref 15–41)
Alkaline Phosphatase: 287 U/L — ABNORMAL HIGH (ref 38–126)
Anion gap: 10 (ref 5–15)
BUN: 29 mg/dL — AB (ref 6–20)
CO2: 27 mmol/L (ref 22–32)
Calcium: 8.6 mg/dL — ABNORMAL LOW (ref 8.9–10.3)
Chloride: 102 mmol/L (ref 101–111)
Creatinine, Ser: 1.12 mg/dL (ref 0.61–1.24)
GFR calc non Af Amer: 60 mL/min — ABNORMAL LOW (ref >60)
Glucose, Bld: 82 mg/dL (ref 65–99)
Potassium: 5 mmol/L (ref 3.5–5.1)
SODIUM: 139 mmol/L (ref 135–145)
Total Bilirubin: 8.2 mg/dL — ABNORMAL HIGH (ref 0.3–1.2)
Total Protein: 5.8 g/dL — ABNORMAL LOW (ref 6.5–8.1)

## 2014-06-18 LAB — URINE CULTURE
Colony Count: NO GROWTH
Culture: NO GROWTH

## 2014-06-18 MED ORDER — LEVOFLOXACIN IN D5W 750 MG/150ML IV SOLN
750.0000 mg | INTRAVENOUS | Status: DC
  Administered 2014-06-18 – 2014-06-20 (×2): 750 mg via INTRAVENOUS
  Filled 2014-06-18 (×3): qty 150

## 2014-06-18 NOTE — Evaluation (Signed)
Clinical/Bedside Swallow Evaluation Patient Details  Name: Shannon Kirby MRN: [id number] Date of Birth: Dec 22, 1931  Today's Date: 06/18/2014 Time: SLP Start Time (ACUTE ONLY): 1500 SLP Stop Time (ACUTE ONLY): 1537 SLP Time Calculation (min) (ACUTE ONLY): 37 min  Past Medical History:  Past Medical History  Diagnosis Date  . Hypertension   . Hernia     L groin   . Hyperlipidemia   . Pre-diabetes   . A-fib   . Stroke     X2  . Urinary retention   . Protein calorie malnutrition   . GI bleed June 2014  . BPH (benign prostatic hyperplasia)   . COPD (chronic obstructive pulmonary disease)   . Anxiety disorder   . Dysrhythmia     afib with stroke  . Cancer    Past Surgical History:  Past Surgical History  Procedure Laterality Date  . No past surgeries    . Tonsillectomy and adenoidectomy  age 60  . Colonoscopy N/A 07/13/2012    Procedure: COLONOSCOPY;  Surgeon: Lear Ng, MD;  Location: [facility] ENDOSCOPY;  Service: Endoscopy;  Laterality: N/A;   HPI:  81 yo male with metastatic soft tissue sarcoma declined chemotherapy, electively to receive supportive care was brought to ER by family due to progressive weakness. Per family, patient has been largely bedbound in the past few months, but was able to ambulate a few steps with a walker in the past. However, the last few days, he was not able to ambulate, also developed difficulty swallowing, patient denies pain, no fever, no n/v. With metastatic soft tissue sarcoma declined chemotherapy, electively to receive supportive care was brought to ER by family due to progressive weakness. Per family, patient has been largely bedbound in the past few months, but was able to ambulate a few steps with a walker in the past. However, the last few days, he was not able to ambulate, also developed difficulty swallowing, patient denies pain, no fever, no n/v.    Assessment / Plan / Recommendation Clinical Impression  Pt appears to swallow without  overt s/s of aspiration, with only mild oral delay noted and a suspected mild delay to initiate the swallow.  No cough, and voice was clear after sips of water via straw.      Aspiration Risk  Mild    Diet Recommendation Thin;Dysphagia 3 (Mech soft) (chopped meats)   Medication Administration: Whole meds with puree Compensations: Slow rate;Small sips/bites;Check for pocketing    Other  Recommendations Oral Care Recommendations: Oral care BID Other Recommendations: Clarify dietary restrictions   Follow Up Recommendations       Frequency and Duration min 3x week  2 weeks   Pertinent Vitals/Pain n/a         Swallow Study Prior Functional Status       General Other Pertinent Information: 81 yo male with metastatic soft tissue sarcoma declined chemotherapy, electively to receive supportive care was brought to ER by family due to progressive weakness. Per family, patient has been largely bedbound in the past few months, but was able to ambulate a few steps with a walker in the past. However, the last few days, he was not able to ambulate, also developed difficulty swallowing, patient denies pain, no fever, no n/v. With metastatic soft tissue sarcoma declined chemotherapy, electively to receive supportive care was brought to ER by family due to progressive weakness. Per family, patient has been largely bedbound in the past few months, but was able to ambulate a few  steps with a walker in the past. However, the last few days, he was not able to ambulate, also developed difficulty swallowing, patient denies pain, no fever, no n/v.  Type of Study: Bedside swallow evaluation Previous Swallow Assessment: none Diet Prior to this Study: NPO Temperature Spikes Noted: No Respiratory Status: Room air History of Recent Intubation: No Behavior/Cognition: Alert;Cooperative;Pleasant mood Oral Cavity - Dentition: Adequate natural dentition/normal for age;Missing dentition Self-Feeding Abilities:  Needs assist Patient Positioning: Upright in bed Baseline Vocal Quality: Normal Volitional Cough: Strong Volitional Swallow: Able to elicit    Oral/Motor/Sensory Function Overall Oral Motor/Sensory Function: Impaired at baseline Labial ROM: Reduced right Labial Symmetry: Within Functional Limits Labial Strength: Reduced Lingual Symmetry: Within Functional Limits Lingual Sensation: Reduced Facial ROM: Reduced right Facial Symmetry: Right droop Facial Strength: Reduced Mandible: Within Functional Limits   Ice Chips Ice chips: Within functional limits Presentation: Spoon   Thin Liquid Thin Liquid: Within functional limits Presentation: Spoon;Cup;Straw    Nectar Thick Nectar Thick Liquid: Not tested   Honey Thick Honey Thick Liquid: Not tested   Puree Puree: Within functional limits Presentation: Spoon   Solid   GO    Solid: Impaired Presentation: Self Fed Oral Phase Impairments: Reduced lingual movement/coordination Oral Phase Functional Implications:  (delayed oral transit)       Batu Cassin T 06/18/2014,3:37 PM

## 2014-06-18 NOTE — Care Management Note (Signed)
Case Management Note  Patient Details  Name: Shannon Kirby MRN: [id number] Date of Birth: 1931-10-07  Subjective/Objective:        81 yo male admitted with UTI from home with daughter            Action/Plan: CM consulted for Palliative care consult. Spoke with Palliative Care NP, who is to have a family meeting on 06/19/14. CM will follow up with Palliative Care recommendations.  Expected Discharge Date:   (UNKNOWN)               Expected Discharge Plan:     In-House Referral:     Discharge planning Services  CM Consult, NA  Post Acute Care Choice:  NA Choice offered to:  NA  DME Arranged:    DME Agency:     HH Arranged:    HH Agency:     Status of Service:  In process, will continue to follow  Medicare Important Message Given:  N/A - LOS <3 / Initial given by admissions Date Medicare IM Given:    Medicare IM give by:    Date Additional Medicare IM Given:    Additional Medicare Important Message give by:     If discussed at [place] of Stay Meetings, dates discussed:    Additional Comments:  Scot Dock, RN 06/18/2014, 3:05 PM

## 2014-06-18 NOTE — Progress Notes (Signed)
ANTIBIOTIC CONSULT NOTE - INITIAL  Pharmacy Consult for Levaquin Indication: pneumonia  No Known Allergies  Patient Measurements: Height: 6\' 3"  (190.5 cm) Weight: 149 lb 11.1 oz (67.9 kg) IBW/kg (Calculated) : 84.5  Vital Signs: Temp: 97.5 F (36.4 C) (05/27 0539) Temp Source: Oral (05/27 0539) BP: 115/80 mmHg (05/27 0539) Pulse Rate: 77 (05/27 0539) Intake/Output from previous day: 05/26 0701 - 05/27 0700 In: 0  Out: 450 [Urine:450] Intake/Output from this shift:    Labs:  Recent Labs  06/17/14 0857 06/18/14 0538  WBC 8.0 8.1  HGB 16.5 15.6  PLT 97* 118*  CREATININE 1.18 1.12   Estimated Creatinine Clearance: 49.7 mL/min (by C-G formula based on Cr of 1.12). No results for input(s): VANCOTROUGH, VANCOPEAK, VANCORANDOM, GENTTROUGH, GENTPEAK, GENTRANDOM, TOBRATROUGH, TOBRAPEAK, TOBRARND, AMIKACINPEAK, AMIKACINTROU, AMIKACIN in the last 72 hours.   Microbiology: No results found for this or any previous visit (from the past 720 hour(s)).  Medical History: Past Medical History  Diagnosis Date  . Hypertension   . Hernia     L groin   . Hyperlipidemia   . Pre-diabetes   . A-fib   . Stroke     X2  . Urinary retention   . Protein calorie malnutrition   . GI bleed June 2014  . BPH (benign prostatic hyperplasia)   . COPD (chronic obstructive pulmonary disease)   . Anxiety disorder   . Dysrhythmia     afib with stroke  . Cancer     Assessment: 36 yoM with history significant for metastatic soft tissue sarcoma, not on chemotherapy, admitted with progressive decline, failure to thrive.  Was started on Ceftriaxone for possible UTI since UA showed small leukocytes and few bacteria.  CXR shows left lower lobe consolidation as well as metastatic disease.  MD ordered to change Ceftriaxone to Levaquin for pneumonia.  MD notes concern for aspiration due to patient's difficulty swallowing.  Pt is currently NPO.  5/26 >> Ceftriaxone >> 5/27 5/27 >> Levaquin >>  Tmax:  afebrile WBC: WNL Renal: SCr 1.12, CrCl~49 ml/min 5/26 urine culture: sent  Goal of Therapy:  Doses adjusted per renal function Eradication of infection  Plan:  Levaquin 750 mg IV q48h.  Would consider Ceftriaxone + Flagyl for coverage of UTI + aspiration pneumonia.  Levaquin may not be adequate if want anaerobic coverage for possible aspiration.    Hershal Coria 06/18/2014,11:57 AM

## 2014-06-18 NOTE — Consult Note (Signed)
Consultation Note Date: 06/18/2014   Patient Name: Shannon Kirby  DOB: [birth year]  MRN: [id number]  Age / Sex: 81 y.o., male   PCP: Shannon Fess, MD Referring Physician: Robbie Lis, MD  Reason for Consultation: Establishing goals of care  Palliative Care Assessment and Plan Summary of Established Goals of Care and Medical Treatment Preferences    Palliative Care Discussion Held Today Contacts/Participants in Discussion: Pt and son Shannon Kirby Primary Decision Maker: 3 children share responsibility. 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 Met with pt and pt's son Shannon Kirby to introduce Palliative Medicine Service as an additional resource.  Code Status/Advance Care Planning:  Full Code. Son seemed surprised by this given his father's previous wishes such as not to pursue chemo, or other aggressive treatment. States he is going to call his siblings to discuss  Dysphagia: Pt going for MBS today. Generally broached the subject of whether he would want a feeding tube if he was unable to swallow safely. Shannon Kirby stated these were things he and his siblings had not thought about. Hoping to have a family meeting tomorrow to discuss further goals of care . Son given " Hard Choices for [employer]" as well as MOST form to review  Symptom Management:  Pt denies pain, or n/v. He is weak. Reports that IVF have helped him to feel better. No changes to current symptom mgt plan of care   Psycho-social/Spiritual:   Support System: Lives with his daughter. Supportive family  Desire for further Chaplaincy support: not at this time  Prognosis: < 6 months. I have not discussed this with pt or family  Discharge Planning:  Probably home with daughter but not finalized whether that will be with home health or hospice support. Pt would qualify for hospice in-home benenfit       Chief Complaint/History of Present Illness: Pt is a 81yo man admitted after  worsening weakness and new onset of difficulty swallowing. 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 Pt has refused chemo or XRT in the past  Primary Diagnoses  Present on Admission:  . UTI (urinary tract infection) . FTT (failure to thrive) in adult  Palliative Review of Systems: Pt is confused and HOH. He denied pain, n/v and is in fact stating he is hungery but is NPO waiting for MBS test.  I have reviewed the medical record, interviewed the patient and family, and examined the patient. The following aspects are pertinent.  Past Medical History  Diagnosis Date  . Hypertension   . Hernia     L groin   . Hyperlipidemia   . Pre-diabetes   . A-fib   . Stroke     X2  . Urinary retention   . Protein calorie malnutrition   . GI bleed June 2014  . BPH (benign prostatic hyperplasia)   . COPD (chronic obstructive pulmonary disease)   . Anxiety disorder   . Dysrhythmia     afib with stroke  . Cancer    History   Social History  . Marital Status: Single    Spouse Name: N/A  . Number of Children: 3  . Years of Education: 9th   Occupational History  . Retired    Social History Main Topics  . Smoking status: Current Every Day Smoker -- 1.00 packs/day for 63 years  . Smokeless tobacco: Never Used     Comment: Smoked since  age 30  . Alcohol Use: No  . Drug Use: No  . Sexual Activity: Not Currently   Other Topics Concern  . None   Social History Narrative   Single, daughter Shannon Kirby lives with him   Has total #3 children   Transportation is son, Shannon Kirby (now has "bad knees")   Ambulatory- Fall Risk   Family History  Problem Relation Age of Onset  . Other Mother     Passed at 57 of "blood clot in chest"  . Colon cancer Father     Passed at 26  . Heart attack Sister     Passed away 60  . Dementia Sister    Scheduled Meds: . antiseptic oral rinse  7 mL Mouth Rinse q12n4p  . chlorhexidine  15 mL Mouth Rinse BID    . enoxaparin (LOVENOX) injection  65 mg Subcutaneous Q12H  . levofloxacin (LEVAQUIN) IV  750 mg Intravenous Q48H  . nicotine  21 mg Transdermal Daily  . sodium chloride  3 mL Intravenous Q12H   Continuous Infusions: . sodium chloride 75 mL/hr at 06/17/14 1852   PRN Meds:.LORazepam Medications Prior to Admission:  Prior to Admission medications   Medication Sig Start Date End Date Taking? Authorizing Provider  ALPRAZolam Duanne Moron) 1 MG tablet Take 0.5-1 mg by mouth 4 (four) times daily -  with meals and at bedtime. Take half of a table three times a day and a whole tablet at bedtime.   Yes Historical Provider, MD  diltiazem (CARDIZEM CD) 120 MG 24 hr capsule Take 1 capsule (120 mg total) by mouth daily. 07/01/12  Yes Bonnielee Haff, MD  escitalopram (LEXAPRO) 20 MG tablet Take 20 mg by mouth daily with lunch.  05/03/14  Yes Historical Provider, MD  feeding supplement (ENSURE COMPLETE) LIQD Take 237 mLs by mouth 2 (two) times daily between meals. 07/14/12  Yes Barton Dubois, MD  finasteride (PROSCAR) 5 MG tablet Take 5 mg by mouth daily with supper.  07/08/12  Yes Historical Provider, MD  polyethylene glycol (MIRALAX / GLYCOLAX) packet Take 17 g by mouth daily. Patient taking differently: Take 17 g by mouth daily as needed for mild constipation.  07/14/12  Yes Barton Dubois, MD  pravastatin (PRAVACHOL) 10 MG tablet Take 10 mg by mouth every evening.   Yes Historical Provider, MD  Rivaroxaban (XARELTO) 20 MG TABS tablet Take 20 mg by mouth daily at 12 noon.    Yes Historical Provider, MD  tamsulosin (FLOMAX) 0.4 MG CAPS capsule Take 0.4 mg by mouth every morning.  05/01/14  Yes Historical Provider, MD   No Known Allergies CBC:    Component Value Date/Time   WBC 8.1 06/18/2014 0538   WBC 7.4 05/13/2014 0930   HGB 15.6 06/18/2014 0538   HGB 16.9 05/13/2014 0930   HCT 44.6 06/18/2014 0538   HCT 49.6 05/13/2014 0930   PLT 118* 06/18/2014 0538   PLT 107* 05/13/2014 0930   MCV 97.6 06/18/2014 0538    MCV 99.0* 05/13/2014 0930   NEUTROABS 6.2 06/17/2014 0857   NEUTROABS 5.1 05/13/2014 0930   LYMPHSABS 0.6* 06/17/2014 0857   LYMPHSABS 1.1 05/13/2014 0930   MONOABS 1.2* 06/17/2014 0857   MONOABS 1.1* 05/13/2014 0930   EOSABS 0.0 06/17/2014 0857   EOSABS 0.1 05/13/2014 0930   BASOSABS 0.0 06/17/2014 0857   BASOSABS 0.0 05/13/2014 0930   Comprehensive Metabolic Panel:    Component Value Date/Time   NA 139 06/18/2014 0538   NA  138 05/10/2014 1312   K 5.0 06/18/2014 0538   K 4.9 05/10/2014 1312   CL 102 06/18/2014 0538   CO2 27 06/18/2014 0538   CO2 26 05/10/2014 1312   BUN 29* 06/18/2014 0538   BUN 22.6 05/10/2014 1312   CREATININE 1.12 06/18/2014 0538   CREATININE 1.5* 05/10/2014 1312   GLUCOSE 82 06/18/2014 0538   GLUCOSE 111 05/10/2014 1312   CALCIUM 8.6* 06/18/2014 0538   CALCIUM 9.9 05/10/2014 1312   AST 69* 06/18/2014 0538   ALT 42 06/18/2014 0538   ALKPHOS 287* 06/18/2014 0538   BILITOT 8.2* 06/18/2014 0538   PROT 5.8* 06/18/2014 0538   ALBUMIN 2.5* 06/18/2014 0538    Physical Exam: Vital Signs: BP 116/75 mmHg  Pulse 108  Temp(Src) 97.4 F (36.3 C) (Oral)  Resp 18  Ht $R'6\' 3"'Hy$  (1.905 m)  Wt 67.9 kg (149 lb 11.1 oz)  BMI 18.71 kg/m2  SpO2 96% SpO2: SpO2: 96 % O2 Device: O2 Device: Not Delivered O2 Flow Rate:   Intake/output summary:  Intake/Output Summary (Last 24 hours) at 06/18/14 1641 Last data filed at 06/18/14 1400  Gross per 24 hour  Intake   1355 ml  Output    550 ml  Net    805 ml   LBM:   Baseline Weight: Weight: 67.9 kg (149 lb 11.1 oz) Most recent weight: Weight: 67.9 kg (149 lb 11.1 oz)  Exam Findings:  General: pt is a cachetic elderly man.  Resp: No work of breathing observed Musculoskeletal: MAE x 4  Skin: jaundiced Neuro: Confused. Alert to self and knows he has cancer. HOH Psych: Affect constricted. Speech difficult to understand but thought processes organized         Palliative Performance Scale: 40%               Additional Data Reviewed: Recent Labs     06/17/14  0857  06/18/14  0538  WBC  8.0  8.1  HGB  16.5  15.6  PLT  97*  118*  NA  136  139  BUN  27*  29*  CREATININE  1.18  1.12     Time In: 1400 Time Out: 1500 Time Total: 60 min  Greater than 50%  of this time was spent counseling and coordinating care related to the above assessment and plan. Attempting to set up family meeting hopefully for tomorrow to review goals of care with children  Signed by: Dory Horn, NP  Dory Horn, NP  06/18/2014, 4:41 PM  Please contact Palliative Medicine Team phone at [phone] for questions and concerns.

## 2014-06-18 NOTE — Progress Notes (Addendum)
Initial Nutrition Assessment  DOCUMENTATION CODES:  Severe malnutrition in context of chronic illness  INTERVENTION: Diet advancement per SLP recommendations  Once diet advanced, recommend Ensure Enlive po QID, each supplement provides 350 kcal and 20 grams of protein  If pt unable to tolerate po, recommend consideration of nutrition support.   RD will continue to monitor  NUTRITION DIAGNOSIS:  Malnutrition related to chronic illness as evidenced by severe depletion of muscle mass, severe depletion of body fat.  GOAL:  Patient will meet greater than or equal to 90% of their needs  MONITOR:  Diet advancement, Labs, Weight trends  REASON FOR ASSESSMENT:  Consult Assessment of nutrition requirement/status  ASSESSMENT: 81 y.o. male with metastatic soft tissue sarcoma declined chemotherapy, electively to receive supportive care was brought to ER by family due to progressive weakness.  - Pt with difficulty swallowing. SLP evaluation pending.  - Very hungry and thirsty. Reports poor po at home.  - Severe fat and muscle depletion. No recent weight loss. Reports usual body weight as 147-148 lbs. - Palliative consult pending.  - RD will monitor for diet advancement.   Labs and medications reviewed   Height:  Ht Readings from Last 1 Encounters:  06/17/14 6\' 3"  (1.905 m)    Weight:  Wt Readings from Last 1 Encounters:  06/17/14 149 lb 11.1 oz (67.9 kg)    Ideal Body Weight:  89.1 kg  Wt Readings from Last 10 Encounters:  06/17/14 149 lb 11.1 oz (67.9 kg)  05/19/14 136 lb (61.689 kg)  05/10/14 137 lb 14.4 oz (62.551 kg)  05/05/13 129 lb 12.8 oz (58.877 kg)  01/29/13 128 lb (58.06 kg)  01/09/13 125 lb (56.7 kg)  10/29/12 127 lb (57.607 kg)  08/11/12 134 lb 12.8 oz (61.145 kg)  07/09/12 132 lb 4.4 oz (60 kg)  07/04/12 134 lb 14.7 oz (61.2 kg)    BMI:  Body mass index is 18.71 kg/(m^2).  Estimated Nutritional Needs:  Kcal:  2000-2200  Protein:  100-120  g  Fluid:  2.0 L/day  Skin:  Reviewed, no issues  Diet Order:   NPO  EDUCATION NEEDS:  Education needs addressed   Intake/Output Summary (Last 24 hours) at 06/18/14 1333 Last data filed at 06/18/14 1256  Gross per 24 hour  Intake   1355 ml  Output    250 ml  Net   1105 ml    Last BM:  5/27  Laurette Schimke [place], [place], [place]

## 2014-06-18 NOTE — Progress Notes (Signed)
Patient ID: Shannon Kirby, male   DOB: 24-Sep-1931, 81 y.o.   MRN: [id number] [facility] PROGRESS NOTE  Shannon Kirby NMM:[id number] DOB: Oct 31, 1931 DOA: 2014/06/25 PCP: [name], MD  Brief narrative:    81 y.o. male with past medical history of metastatic soft tissue sarcoma, has declined chemotherapy. He presented to [facility] hospital because of progressive decline, failure to thrive. Patient lives with his daughter at home who is his primary caregiver. Over last couple of days she noticed that her father could not and had difficulty swallowing. He also remained mostly bedbound. On admission, patient was hemodynamically stable. His blood work was notable for mild thrombocytopenia of 97 otherwise unremarkable. He was found to have urinary tract infection based on urinalysis and he was started empirically on IV Rocephin. He was admitted for hydration and evaluation of generalized weakness.  Barrier to discharge: Awaiting palliative care to address goals of care, swallowing evaluation. Also, patient is on IV Rocephin for UTI. Awaiting urine culture results. Anticipate discharge by Monday, 06/21/2014.   Assessment/Plan:    Principal problem: Generalized weakness / progressive failure to thrive / functional quadriplegia / Dehydration  - Likely secondary to progressive metastatic soft tissue sarcoma.  - Appreciate swallow evaluation and recommendations  - Awaiting palliative care to address goals of care  - PT evaluation if patient able to participate  - Provide supportive care with IV fluids and analgesia as needed.   Active Problems: Urinary tract infection  - Urinalysis on admission showed small leukocytes and few bacteria. Patient was started empirically on IV Rocephin. I will change this to Levaquin because of concern for aspiration pneumonia indicated below. - Follow up urine culture results.   Severe protein calorie malnutrition - In the context of chronic illness,  malignancy. - Awaiting swallowing evaluation.  Metastatic soft tissue sarcoma / pulmonary metastasis / Possible lobar pneumonia or aspiration pneumonitis / Acute respiratory failure with hypoxia - Chest x-ray on the admission showed left lower lobe airspace consolidation. Considering that patient presented with difficulty swallowing there is a concern for aspiration. - Will change Rocephin to Levaquin. - Obtain swallow evaluation - Respiratory status stable.  Thrombocytopenia - Secondary to history of malignancy. - Platelet count is 118. Stable.    DVT Prophylaxis  - SCDs bilaterally   Code Status: Full.  Family Communication:  plan of care discussed with the patient's son at the bedside. Disposition Plan:  awaiting palliative care consult for goals of care.   IV access:  Peripheral IV  Procedures and diagnostic studies:    Dg Chest Port 1 View 06-25-2014   Left lower lobe airspace consolidation. Evidence of metastatic disease with multiple pulmonary nodular lesions and bony metastases. These lesions are better seen on CT than radiography, particularly the bony metastatic lesions.     Medical Consultants:  Palliative care  Other Consultants:  Physical therapy Nutrition SLP  IAnti-Infectives:   Rocephin June 25, 2014 -->  Levaquin at 06/18/2014 -->   Leisa Lenz, MD  [facility] Pager [phone]  Time spent in minutes: 25 minutes  If 7PM-7AM, please contact night-coverage [email or web address] Password [facility] 06/18/2014, 11:16 AM   LOS: 1 day    HPI/Subjective: No acute overnight events. Patient reports no pain.   Objective: Filed Vitals:   06-25-14 1500 06-25-2014 1621 2014-06-25 2155 06/18/14 0539  BP: 94/60 111/67 118/74 115/80  Pulse: 73 83 84 77  Temp:  98.5 F (36.9 C) 97.4 F (36.3 C) 97.5 F (36.4 C)  TempSrc:  Oral Oral  Oral  Resp: 18 20 18 18   Height:  6\' 3"  (1.905 m)    Weight:  67.9 kg (149 lb 11.1 oz)    SpO2: 87% 93% 92% 93%    Intake/Output  Summary (Last 24 hours) at 06/18/14 1116 Last data filed at 06/18/14 0604  Gross per 24 hour  Intake      0 ml  Output    250 ml  Net   -250 ml    Exam:   General:  Pt is alert, not in acute distress  Cardiovascular: irregular rhythm, rate controlled, S1/S2 (+)  Respiratory: Clear to auscultation bilaterally, no wheezing, no crackles, no rhonchi  Abdomen: Soft, non tender, non distended, bowel sounds present  Extremities: No edema, pulses DP and PT palpable bilaterally  Neuro: Grossly nonfocal  Data Reviewed: Basic Metabolic Panel:  Recent Labs Lab 06/17/14 0857 06/18/14 0538  NA 136 139  K 4.3 5.0  CL 100* 102  CO2 25 27  GLUCOSE 133* 82  BUN 27* 29*  CREATININE 1.18 1.12  CALCIUM 8.8* 8.6*   Liver Function Tests:  Recent Labs Lab 06/17/14 0857 06/18/14 0538  AST 84* 69*  ALT 52 42  ALKPHOS 324* 287*  BILITOT 7.4* 8.2*  PROT 6.3* 5.8*  ALBUMIN 2.7* 2.5*   No results for input(s): LIPASE, AMYLASE in the last 168 hours. No results for input(s): AMMONIA in the last 168 hours. CBC:  Recent Labs Lab 06/17/14 0857 06/18/14 0538  WBC 8.0 8.1  NEUTROABS 6.2  --   HGB 16.5 15.6  HCT 46.8 44.6  MCV 97.5 97.6  PLT 97* 118*   Cardiac Enzymes:  Recent Labs Lab 06/17/14 0857  TROPONINI <0.03   BNP: Invalid input(s): POCBNP CBG: No results for input(s): GLUCAP in the last 168 hours.  No results found for this or any previous visit (from the past 240 hour(s)).   Scheduled Meds: . cefTRIAXone (ROCEPHIN)  IV  1 g Intravenous Q24H  . enoxaparin (LOVENOX) injection  65 mg Subcutaneous Q12H  . nicotine  21 mg Transdermal Daily   Continuous Infusions: . sodium chloride 75 mL/hr at 06/17/14 6712

## 2014-06-18 NOTE — Telephone Encounter (Signed)
Due to call day 6/7 f/u moved to 6/17. Spoke with care taker and mailed schedule.

## 2014-06-19 DIAGNOSIS — L8990 Pressure ulcer of unspecified site, unspecified stage: Secondary | ICD-10-CM | POA: Insufficient documentation

## 2014-06-19 DIAGNOSIS — Z515 Encounter for palliative care: Secondary | ICD-10-CM

## 2014-06-19 DIAGNOSIS — J181 Lobar pneumonia, unspecified organism: Secondary | ICD-10-CM

## 2014-06-19 LAB — CLOSTRIDIUM DIFFICILE BY PCR: Toxigenic C. Difficile by PCR: NEGATIVE

## 2014-06-19 MED ORDER — LOPERAMIDE HCL 1 MG/5ML PO LIQD
1.0000 mg | Freq: Three times a day (TID) | ORAL | Status: DC | PRN
  Administered 2014-06-19: 1 mg via ORAL
  Filled 2014-06-19 (×2): qty 5

## 2014-06-19 NOTE — Progress Notes (Signed)
PT Cancellation Note  Patient Details Name: Shannon Kirby MRN: [id number] DOB: 25-Mar-1931   Cancelled Treatment:    Reason Eval/Treat Not Completed: Medical issues which prohibited therapy (frequent stools.)   [name] 06/19/2014, 3:47 PM

## 2014-06-19 NOTE — Progress Notes (Signed)
Daily Progress Note   Patient Name: Shannon Kirby       Date: 06/19/2014 DOB: October 03, 1931  Age: 81 y.o. MRN#: [id number] Attending Physician: Robbie Lis, MD Primary Care Physician: [name], MD Admit Date: 06/17/2014  Reason for Consultation/Follow-up: Establishing goals of care  Meeting with 2 sons and daughter . Family elected DNR in keeping with their father's wishes as well as home with hospice when medically ready.  Subjective: Pt continues to deny pain, dyspnea. He is very weak. Eating comfort foods such as ice cream without difficulty Interval Events: No events Length of Stay: 2 days  Current Medications: Scheduled Meds:  . antiseptic oral rinse  7 mL Mouth Rinse q12n4p  . chlorhexidine  15 mL Mouth Rinse BID  . enoxaparin (LOVENOX) injection  65 mg Subcutaneous Q12H  . levofloxacin (LEVAQUIN) IV  750 mg Intravenous Q48H  . nicotine  21 mg Transdermal Daily  . sodium chloride  3 mL Intravenous Q12H    Continuous Infusions:    PRN Meds: LORazepam  Palliative Performance Scale: 20-30%%     Vital Signs: BP 118/76 mmHg  Pulse 84  Temp(Src) 97.2 F (36.2 C) (Oral)  Resp 18  Ht 6\' 3"  (1.905 m)  Wt 67.9 kg (149 lb 11.1 oz)  BMI 18.71 kg/m2  SpO2 96% SpO2: SpO2: 96 % O2 Device: O2 Device: Not Delivered O2 Flow Rate:    Intake/output summary:  Intake/Output Summary (Last 24 hours) at 06/19/14 1339 Last data filed at 06/19/14 0957  Gross per 24 hour  Intake    690 ml  Output    500 ml  Net    190 ml   LBM:   Baseline Weight: Weight: 67.9 kg (149 lb 11.1 oz) Most recent weight: Weight: 67.9 kg (149 lb 11.1 oz)  Physical Exam: General : elderly frail cachetic man. Looks ill, weak      Resp: No work of breathing noted Musculoskeletal: MAE x 4  Neuro: HOH    Additional Data Reviewed: Recent Labs     06/17/14  0857  06/18/14  0538  WBC  8.0  8.1  HGB  16.5  15.6  PLT  97*  118*  NA  136  139  BUN  27*  29*  CREATININE  1.18  1.12      Problem List:  Patient Active Problem List   Diagnosis Date Noted  . Pressure ulcer 06/19/2014  . Palliative care encounter   . UTI (urinary tract infection) 06/17/2014  . FTT (failure to thrive) in adult 06/17/2014  . Soft tissue sarcoma 05/19/2014  . GI bleed 07/13/2012  . Protein-calorie malnutrition, severe 07/09/2012  . CVA (cerebral infarction) 07/08/2012  . Orthostatic hypotension 07/02/2012  . UTI (lower urinary tract infection) 07/02/2012  . Near syncope 07/02/2012  . Acute urinary retention 06/30/2012  . Atrial fibrillation 06/30/2012  . HTN (hypertension), benign 06/30/2012  . Anxiety 06/30/2012     Palliative Care Assessment & Plan    Code Status:  DNR  Goals of Care:  Family recognizes decline. Understands that he is declining and comfort if their approach. Code status changed. Case mgt laced for hospice consult  Desire for further Chaplaincy support: no  3. Symptom Management:  Denies pain, dyspnea  4. Palliative Prophylaxis:  Stool Softner: n/a  5. Prognosis: < 6 months  5. Discharge Planning: Home with Hospice   Care plan was discussed with sons, daughter and pt  Thank you for allowing the Palliative Medicine Team  to assist in the care of this patient.   Time In: 1215 Time Out: 1300 Total Time 45 Prolonged Time Billed  no    Greater than 50%  of this time was spent counseling and coordinating care related to the above assessment and plan.   Dory Horn, NP  06/19/2014, 1:39 PM  Please contact Palliative Medicine Team phone at [phone] for questions and concerns.

## 2014-06-19 NOTE — Progress Notes (Addendum)
Patient ID: Shannon Kirby, male   DOB: 21-Jun-1931, 81 y.o.   MRN: [id number] [facility] PROGRESS NOTE  HRIDAAN BOUSE TML:[id number] DOB: 08/06/1931 DOA: 2014-06-22 PCP: [name], MD  Brief narrative:    81 y.o. male with past medical history of metastatic soft tissue sarcoma, has declined chemotherapy. He presented to [facility] long hospital because of progressive decline and failure to thrive.   On admission, patient was hemodynamically stable. His blood work was notable for mild thrombocytopenia of 97 otherwise unremarkable. He was found to have urinary tract infection based on urinalysis and he was started empirically on IV Rocephin. His CXR was however notable for possible pneumonia in left lower lobe so antibiotics were changed to Levaquin instead of rocephin.  Barrier to discharge: Continue present treatment for pneumonia / UTI with Levaquin. Anticipate discharge by Monday, 06/21/2014.   Assessment/Plan:    Principal problem: Generalized weakness / progressive failure to thrive / functional quadriplegia / Dehydration  - Likely secondary to progression of metastatic soft tissue sarcoma.  - Has been seen  By SLP in consultation. Recommended dysphagia 3 diet. Tolerated it well. - Would still continue with aspiration precautions. Elevate head of bead when eating.  - Awaiting PT eval once pt able to participate. - PCT consulted for goals of care  Active Problems: Urinary tract infection  - Urinalysis on admission showed small leukocytes and few bacteria.  - Patient was started empirically on IV Rocephin. Changed to Levaquin to cover for pneumonia as well.  - Follow up urine culture results.   Severe protein calorie malnutrition - In the context of chronic illness, malignancy. - Now on dysphagia 3 diet per SLP evaluation.   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 Always a concern that pt may have aspirated considering his initial presentation of difficulty swallowing.  - Continue Levaquin - Dysphagia 3 diet - Respiratory status stable.  Thrombocytopenia - Secondary to history of malignancy. - Platelet count is 118. Stable.  Atrial fibrillation - CHADS vasc score at least 4 (age, HTN, CVA) - On AC with xarelto but changed to Lovenox due to initial presentation of difficulty swallowing - Rate controlled   Stage 2 pressure ulcer - Left hip, partial thickness loss of dermis presenting as a shallow open ulcer with a red, pink wound bed without slough.    DVT Prophylaxis  - On full dose AC with Lovenox subQ   Code Status: Full.  Family Communication:  plan of care discussed with the patient's sons at the bedside. Disposition Plan:  Anticipate D/C by 06/21/2014.  IV access:  Peripheral IV  Procedures and diagnostic studies:    Dg Chest Port 1 View Jun 22, 2014   Left lower lobe airspace consolidation. Evidence of metastatic disease with multiple pulmonary nodular lesions and bony metastases. These lesions are better seen on CT than radiography, particularly the bony metastatic lesions.     Medical Consultants:  Palliative care  Other Consultants:  Physical therapy Nutrition SLP  IAnti-Infectives:   Rocephin 06-22-14 --> 06/18/2014 Levaquin at 06/18/2014 -->   Leisa Lenz, MD  [facility] Pager [phone]  Time spent in minutes: 15 minutes  If 7PM-7AM, please contact night-coverage [email or web address] Password [email or web address] 06/19/2014, 11:09 AM   LOS: 2 days    HPI/Subjective: No acute overnight events. Patient reports no pain. Slept good per family report.  Objective: Filed Vitals:   06/18/14 0539 06/18/14 1400 06/18/14 2300 06/19/14 0650  BP: 115/80 116/75 126/77 118/76  Pulse: 77 108 83 84  Temp: 97.5 F (36.4 C) 97.4 F (36.3 C) 97.9 F (36.6 C) 97.2 F  (36.2 C)  TempSrc: Oral Oral Oral Oral  Resp: 18 18 18 18   Height:      Weight:      SpO2: 93% 96% 98% 96%    Intake/Output Summary (Last 24 hours) at 06/19/14 1109 Last data filed at 06/19/14 0600  Gross per 24 hour  Intake   1985 ml  Output    500 ml  Net   1485 ml    Exam:   General:  Pt is alert, not in acute distress  Cardiovascular: irregular rhythm, appreciate S1/S2 (+)  Respiratory: no rhonchi, no wheezing   Abdomen: (+) BS, non tender abdomen   Extremities: No leg swelling, pulses palpable   Neuro: Nonfocal  Data Reviewed: Basic Metabolic Panel:  Recent Labs Lab 06/17/14 0857 06/18/14 0538  NA 136 139  K 4.3 5.0  CL 100* 102  CO2 25 27  GLUCOSE 133* 82  BUN 27* 29*  CREATININE 1.18 1.12  CALCIUM 8.8* 8.6*   Liver Function Tests:  Recent Labs Lab 06/17/14 0857 06/18/14 0538  AST 84* 69*  ALT 52 42  ALKPHOS 324* 287*  BILITOT 7.4* 8.2*  PROT 6.3* 5.8*  ALBUMIN 2.7* 2.5*   No results for input(s): LIPASE, AMYLASE in the last 168 hours. No results for input(s): AMMONIA in the last 168 hours. CBC:  Recent Labs Lab 06/17/14 0857 06/18/14 0538  WBC 8.0 8.1  NEUTROABS 6.2  --   HGB 16.5 15.6  HCT 46.8 44.6  MCV 97.5 97.6  PLT 97* 118*   Cardiac Enzymes:  Recent Labs Lab 06/17/14 0857  TROPONINI <0.03   BNP: Invalid input(s): POCBNP CBG: No results for input(s): GLUCAP in the last 168 hours.  Recent Results (from the past 240 hour(s))  Urine culture     Status: None   Collection Time: 06/17/14 12:42 PM  Result Value Ref Range Status   Specimen Description IN/OUT CATH URINE  Final   Special Requests NONE  Final   Colony Count NO GROWTH Performed at [facility]   Final   Culture NO GROWTH Performed at [facility]   Final   Report Status 06/18/2014 FINAL  Final     Scheduled Meds: . cefTRIAXone (ROCEPHIN)  IV  1 g Intravenous Q24H  . enoxaparin (LOVENOX) injection  65 mg Subcutaneous Q12H  .  nicotine  21 mg Transdermal Daily   Continuous Infusions:

## 2014-06-20 DIAGNOSIS — L8992 Pressure ulcer of unspecified site, stage 2: Secondary | ICD-10-CM

## 2014-06-20 MED ORDER — ESCITALOPRAM OXALATE 20 MG PO TABS
20.0000 mg | ORAL_TABLET | Freq: Every day | ORAL | Status: DC
  Administered 2014-06-20 – 2014-06-22 (×3): 20 mg via ORAL
  Filled 2014-06-20 (×3): qty 1

## 2014-06-20 MED ORDER — PRAVASTATIN SODIUM 10 MG PO TABS
10.0000 mg | ORAL_TABLET | Freq: Every evening | ORAL | Status: DC
  Administered 2014-06-20 – 2014-06-21 (×2): 10 mg via ORAL
  Filled 2014-06-20 (×3): qty 1

## 2014-06-20 MED ORDER — FINASTERIDE 5 MG PO TABS
5.0000 mg | ORAL_TABLET | Freq: Every day | ORAL | Status: DC
  Administered 2014-06-20 – 2014-06-21 (×2): 5 mg via ORAL
  Filled 2014-06-20 (×3): qty 1

## 2014-06-20 MED ORDER — TAMSULOSIN HCL 0.4 MG PO CAPS
0.4000 mg | ORAL_CAPSULE | Freq: Every morning | ORAL | Status: DC
  Administered 2014-06-20 – 2014-06-22 (×3): 0.4 mg via ORAL
  Filled 2014-06-20 (×3): qty 1

## 2014-06-20 NOTE — Evaluation (Signed)
Physical Therapy Evaluation Patient Details Name: Shannon Kirby MRN: [id number] DOB: 1931-03-27 Today's Date: 06/20/2014   History of Present Illness  With metastatic soft tissue sarcoma declined chemotherapy, electively to receive supportive care was brought to ER 06/17/14 by family due to progressive weakness. Per family, patient has been largely bedbound in the past few months, but was able to ambulate a few steps with a walker in the past. However, the last few days, he was not able to ambulate, also developed difficulty swallowing, patient denies pain, no fever, no n/v.   Clinical Impression  Patient is very weak but able to stand and pivot. Patient is a high fall risk if not assisted. Daughter indicates plan for DC home. Will need 24/7 caregivers. Patient will benefit from PT while in acute care to  Assist with mobility and level of care.     Follow Up Recommendations Supervision/Assistance - 24 hour (if DC home w/ Hospice, HHPT not indicated.)    Equipment Recommendations  Hospital bed (daughter interested in bed/chair alarm if available)    Recommendations for Other Services       Precautions / Restrictions Precautions Precautions: Fall      Mobility  Bed Mobility Overal bed mobility: Needs Assistance Bed Mobility: Supine to Sit     Supine to sit: Mod assist     General bed mobility comments: use of rail, support at trunk  Transfers Overall transfer level: Needs assistance Equipment used: Rolling walker (2 wheeled) Transfers: Sit to/from [employer] Transfers Sit to Stand: From elevated surface;+2 safety/equipment;+2 physical assistance;Mod assist Stand pivot transfers: +2 safety/equipment;+2 physical assistance;Mod assist       General transfer comment: cues for safety, hand oplacement,  stood , multimodal cues to take steps and move RW as patient turned, cues to turn completely and reach to recliner prior to sitting. knees flexed, support at knees to ensure no  buckling  Ambulation/Gait                Stairs            Wheelchair Mobility    Modified Rankin (Stroke Patients Only)       Balance Overall balance assessment: History of Falls;Needs assistance Sitting-balance support: Feet supported;No upper extremity supported Sitting balance-Leahy Scale: Fair     Standing balance support: During functional activity;Bilateral upper extremity supported Standing balance-Leahy Scale: Poor                               Pertinent Vitals/Pain Pain Assessment: No/denies pain    Home Living Family/patient expects to be discharged to:: Private residence Living Arrangements: Children Available Help at Discharge: Family;Available 24 hours/day Type of Home: House Home Access: Stairs to enter Entrance Stairs-Rails: Left Entrance Stairs-Number of Steps: 1 or 5 Home Layout: One level Home Equipment: Walker - 2 wheels;Bedside commode;Wheelchair - manual;Shower seat      Prior Function Level of Independence: Needs assistance   Gait / Transfers Assistance Needed: minimally ambulatory PTA per daughter  ADL's / Homemaking Assistance Needed: assistance required        Hand Dominance        Extremity/Trunk Assessment   Upper Extremity Assessment: Generalized weakness           Lower Extremity Assessment: Generalized weakness      Cervical / Trunk Assessment: Kyphotic  Communication      Cognition Arousal/Alertness: Awake/alert Behavior During Therapy: WFL for tasks assessed/performed Overall  Cognitive Status: Within Functional Limits for tasks assessed (did not ask patient specific questions, daughter present and ansered a lot.)                      General Comments      Exercises        Assessment/Plan    PT Assessment Patient needs continued PT services  PT Diagnosis Difficulty walking;Generalized weakness   PT Problem List Decreased strength;Decreased range of motion;Decreased  activity tolerance;Decreased balance;Decreased mobility;Decreased knowledge of precautions;Decreased safety awareness;Decreased knowledge of use of DME  PT Treatment Interventions DME instruction;Gait training;Functional mobility training;Therapeutic activities;Therapeutic exercise;Patient/family education   PT Goals (Current goals can be found in the Care Plan section) Acute Rehab PT Goals Patient Stated Goal: to get up PT Goal Formulation: With patient/family Time For Goal Achievement: 07/04/14 Potential to Achieve Goals: Fair    Frequency Min 3X/week   Barriers to discharge        Co-evaluation               End of Session Equipment Utilized During Treatment: Gait belt Activity Tolerance: Patient tolerated treatment well Patient left: in chair;with call bell/phone within reach;with chair alarm set;with family/visitor present Nurse Communication: Mobility status         Time: 3428-7681 PT Time Calculation (min) (ACUTE ONLY): 21 min   Charges:   PT Evaluation $Initial PT Evaluation Tier I: 1 Procedure     PT G CodesClaretha Cooper 06/20/2014, 1:35 PM Tresa Endo PT [phone]

## 2014-06-20 NOTE — Progress Notes (Addendum)
Patient ID: Shannon Kirby, male   DOB: 1931-03-02, 81 y.o.   MRN: [id number] [facility] PROGRESS NOTE  KIONTE BAUMGARDNER GUR:[id number] DOB: 11-11-1931 DOA: 2014/07/11 PCP: [name], MD  Brief narrative:    81 y.o. male with past medical history of metastatic soft tissue sarcoma, has declined chemotherapy. He presented to [facility] long hospital because of progressive decline and failure to thrive.   On admission, patient was hemodynamically stable. His blood work was notable for mild thrombocytopenia of 97 otherwise unremarkable. He was found to have urinary tract infection based on urinalysis and he was started empirically on IV Rocephin. His CXR was however notable for possible pneumonia in left lower lobe so antibiotics were changed to Levaquin instead of rocephin.  Barrier to discharge: Continue present treatment for pneumonia / UTI with Levaquin. Anticipate discharge by Tuesday, 06/22/2014.   Assessment/Plan:    Principal problem: Generalized weakness / progressive failure to thrive / functional quadriplegia / Dehydration  - Likely secondary to progression of metastatic soft tissue sarcoma. - Per SLP diet changed to dysphagia 3, tolerates well - Palliative consulted for goals of care  - Appreciate PT eval once patient able to participate   Active Problems: Urinary tract infection  - Urinalysis on admission showed small leukocytes and few bacteria.  - Patient was on Rocephin initially. Switched to Levaquin to cover better for possible PNA - Urine culture showed no growth.  Severe protein calorie malnutrition - In the context of chronic illness, malignancy. 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  - Pt initially on rocephin but  changed subsequently to Levaquin - Respiratory status stable.  Thrombocytopenia - Secondary to history of malignancy. - Platelet count is 118.   Atrial fibrillation - CHADS vasc score at least 4 (age, HTN, CVA) - On AC with xarelto but changed to Lovenox due to initial presentation of difficulty swallowing.  - Change to Xarelto prior to discharge. - Rate controlled. Cardizem on hold due to borderline soft BP and HR of 61   Stage 2 pressure ulcer - Left hip, partial thickness loss of dermis presenting as a shallow open ulcer with a red, pink wound bed without slough.    DVT Prophylaxis  - On full dose AC with Lovenox subQ   Code Status: DNR/DNI Family Communication: plan of care discussed with the patient's sons at the bedside. Disposition Plan: Anticipate D/C by 06/22/2014.   IV access:  Peripheral IV  Procedures and diagnostic studies:    Dg Chest Port 1 View July 11, 2014 Left lower lobe airspace consolidation. Evidence of metastatic disease with multiple pulmonary nodular lesions and bony metastases. These lesions are better seen on CT than radiography, particularly the bony metastatic lesions.   Medical Consultants:  Palliative care  Other Consultants:  PT Nutrition SLP  IAnti-Infectives:   Rocephin 07/11/2014 --> 06/18/2014 Levaquin at 06/18/2014 -->   Leisa Lenz, MD  [facility] Pager [phone]  Time spent in minutes: 25 minutes  If 7PM-7AM, please contact night-coverage [email or web address] Password [facility] 06/20/2014, 11:17 AM   LOS: 3 days    HPI/Subjective: No acute overnight events. Patient reports no pain. No shortness of breath. Ate ice cream, apple source, yoghurt.   Objective: Filed Vitals:   06/19/14 6160 06/19/14 1504 06/19/14 2130 06/20/14 0613  BP:  118/76 118/68 115/80 124/86  Pulse: 84 84 100 61  Temp: 97.2 F (36.2 C) 97.8 F (36.6 C) 98 F (36.7 C) 97.8 F (36.6 C)  TempSrc: Oral Oral Oral Oral  Resp: 18 18 18 18    Height:      Weight:      SpO2: 96% 98% 94% 94%    Intake/Output Summary (Last 24 hours) at 06/20/14 1117 Last data filed at 06/20/14 0909  Gross per 24 hour  Intake    220 ml  Output    600 ml  Net   -380 ml    Exam:   General:  Pt is awake,not in acute distress  Cardiovascular: Irregular rhythm rate controlled, (+) S1,S2  Respiratory: No wheezing, no crackles, no rhonchi  Abdomen: Soft, non tender, non distended, bowel sounds present  Extremities: No leg swelling, pulses palpable   Neuro: Grossly nonfocal  Data Reviewed: Basic Metabolic Panel:  Recent Labs Lab 06/17/14 0857 06/18/14 0538  NA 136 139  K 4.3 5.0  CL 100* 102  CO2 25 27  GLUCOSE 133* 82  BUN 27* 29*  CREATININE 1.18 1.12  CALCIUM 8.8* 8.6*   Liver Function Tests:  Recent Labs Lab 06/17/14 0857 06/18/14 0538  AST 84* 69*  ALT 52 42  ALKPHOS 324* 287*  BILITOT 7.4* 8.2*  PROT 6.3* 5.8*  ALBUMIN 2.7* 2.5*   No results for input(s): LIPASE, AMYLASE in the last 168 hours. No results for input(s): AMMONIA in the last 168 hours. CBC:  Recent Labs Lab 06/17/14 0857 06/18/14 0538  WBC 8.0 8.1  NEUTROABS 6.2  --   HGB 16.5 15.6  HCT 46.8 44.6  MCV 97.5 97.6  PLT 97* 118*   Cardiac Enzymes:  Recent Labs Lab 06/17/14 0857  TROPONINI <0.03   BNP: Invalid input(s): POCBNP CBG: No results for input(s): GLUCAP in the last 168 hours.  Urine culture     Status: None   Collection Time: 06/17/14 12:42 PM  Result Value Ref Range Status   Specimen Description IN/OUT CATH URINE  Final   Special Requests NONE  Final   Culture NO GROWTH Performed at [facility]   Final   Report Status 06/18/2014 FINAL  Final  Clostridium Difficile by PCR     Status: None   Collection Time: 06/19/14 11:50 AM  Result Value Ref Range Status   C difficile by pcr NEGATIVE NEGATIVE Final     Scheduled Meds: . enoxaparin (LOVENOX)   65 mg Subcutaneous Q12H  . escitalopram  20 mg Oral Q  lunch  . finasteride  5 mg Oral Q supper  . levofloxacin   750 mg Intravenous Q48H  . nicotine  21 mg Transdermal Daily  . pravastatin  10 mg Oral QPM  . tamsulosin  0.4 mg Oral q morning - 10a

## 2014-06-20 NOTE — Progress Notes (Signed)
ANTICOAGULATION CONSULT NOTE - Initial Consult  Pharmacy Consult for Lovenox Indication: atrial fibrillation  No Known Allergies  Patient Measurements: Height: 6\' 3"  (190.5 cm) Weight: 149 lb 11.1 oz (67.9 kg) IBW/kg (Calculated) : 84.5  Vital Signs: Temp: 97.8 F (36.6 C) (05/29 0613) Temp Source: Oral (05/29 8032) BP: 124/86 mmHg (05/29 0613) Pulse Rate: 61 (05/29 0613)  Labs:  Recent Labs  06/18/14 0538  HGB 15.6  HCT 44.6  PLT 118*  CREATININE 1.12    Estimated Creatinine Clearance: 49.7 mL/min (by C-G formula based on Cr of 1.12).   Medical History: Past Medical History  Diagnosis Date  . Hypertension   . Hernia     L groin   . Hyperlipidemia   . Pre-diabetes   . A-fib   . Stroke     X2  . Urinary retention   . Protein calorie malnutrition   . GI bleed June 2014  . BPH (benign prostatic hyperplasia)   . COPD (chronic obstructive pulmonary disease)   . Anxiety disorder   . Dysrhythmia     afib with stroke  . Cancer     Medications:  Scheduled:  . antiseptic oral rinse  7 mL Mouth Rinse q12n4p  . chlorhexidine  15 mL Mouth Rinse BID  . enoxaparin (LOVENOX) injection  65 mg Subcutaneous Q12H  . levofloxacin (LEVAQUIN) IV  750 mg Intravenous Q48H  . nicotine  21 mg Transdermal Daily  . sodium chloride  3 mL Intravenous Q12H   Infusions:    PRN: loperamide, LORazepam  Assessment: 81 yo male with metastatic soft tissue sarcoma who has declined chemotherapy, electived to receive supportive care, was brought to ER by family due to progressive weakness. On xarelto 20mg  daily for chronic afib which has been held on admission due to NPO status. Therefore, Pharmacy has been consulted to dose lovenox while off xarelto.  Weight: 67.9 kg  SCr 1.12, CrCl 93ml/min  H/H ok. Pltc now >100  No bleeding reported  Tolerating diet.   Noted plans for hospice.  Goal of Therapy:  Anti-Xa level 0.6-1 units/ml 4hrs after LMWH dose given Monitor platelets  by anticoagulation protocol: Yes   Plan:   Cont Lovenox 65mg  SQ q12h  Monitor renal function and adjust if needed  Follow CBC, signs/symptoms of bleeding  F/u ability to resume xarelto when appropriate  Romeo Rabon, PharmD, pager [phone]. 06/20/2014,9:56 AM.

## 2014-06-20 NOTE — Progress Notes (Signed)
PT Cancellation Note  Patient Details Name: Shannon Kirby MRN: [id number] DOB: 12-19-1931   Cancelled Treatment:    Reason Eval/Treat Not Completed: Fatigue/lethargy limiting ability to participate (pt refused getting OOB, stated he wants to nap right now, agreed to PT attempting later today or tomorrow. )   Philomena Doheny 06/20/2014, 9:39 AM [phone]

## 2014-06-21 DIAGNOSIS — L8990 Pressure ulcer of unspecified site, unspecified stage: Secondary | ICD-10-CM

## 2014-06-21 MED ORDER — LOPERAMIDE HCL 1 MG/5ML PO LIQD
1.0000 mg | Freq: Four times a day (QID) | ORAL | Status: DC | PRN
  Administered 2014-06-22: 1 mg via ORAL
  Filled 2014-06-21 (×2): qty 5

## 2014-06-21 MED ORDER — RIVAROXABAN 15 MG PO TABS
15.0000 mg | ORAL_TABLET | Freq: Every day | ORAL | Status: DC
  Administered 2014-06-21: 15 mg via ORAL
  Filled 2014-06-21 (×2): qty 1

## 2014-06-21 NOTE — Progress Notes (Signed)
ANTICOAGULATION CONSULT NOTE - Follow Up Consult  Pharmacy Consult for Lovenox >> Rivaroxaban Indication: Atrial fibrillation  No Known Allergies  Patient Measurements: Height: 6\' 3"  (190.5 cm) Weight: 149 lb 11.1 oz (67.9 kg) IBW/kg (Calculated) : 84.5  Vital Signs: Temp: 97.6 F (36.4 C) (05/30 0709) Temp Source: Oral (05/30 0709) BP: 113/85 mmHg (05/30 0709) Pulse Rate: 91 (05/30 0709)  Labs: No results for input(s): HGB, HCT, PLT, APTT, LABPROT, INR, HEPARINUNFRC, CREATININE, CKTOTAL, CKMB, TROPONINI in the last 72 hours.  Estimated Creatinine Clearance: 49.7 mL/min (by C-G formula based on Cr of 1.12).   Medications:  Prescriptions prior to admission  Medication Sig Dispense Refill Last Dose  . ALPRAZolam (XANAX) 1 MG tablet Take 0.5-1 mg by mouth 4 (four) times daily -  with meals and at bedtime. Take half of a table three times a day and a whole tablet at bedtime.   06/17/2014 at 0630  . diltiazem (CARDIZEM CD) 120 MG 24 hr capsule Take 1 capsule (120 mg total) by mouth daily. 30 capsule 1 06/17/2014 at 0630  . escitalopram (LEXAPRO) 20 MG tablet Take 20 mg by mouth daily with lunch.    06/16/2014 at 1200  . feeding supplement (ENSURE COMPLETE) LIQD Take 237 mLs by mouth 2 (two) times daily between meals.   Past Week at Unknown time  . finasteride (PROSCAR) 5 MG tablet Take 5 mg by mouth daily with supper.    06/16/2014 at 1600  . polyethylene glycol (MIRALAX / GLYCOLAX) packet Take 17 g by mouth daily. (Patient taking differently: Take 17 g by mouth daily as needed for mild constipation. ) 30 each 2 Past Week at Unknown time  . pravastatin (PRAVACHOL) 10 MG tablet Take 10 mg by mouth every evening.   06/16/2014 at 1600  . Rivaroxaban (XARELTO) 20 MG TABS tablet Take 20 mg by mouth daily at 12 noon.    06/16/2014 at 1200  . tamsulosin (FLOMAX) 0.4 MG CAPS capsule Take 0.4 mg by mouth every morning.    06/17/2014 at 0630   Scheduled:  . antiseptic oral rinse  7 mL Mouth Rinse  q12n4p  . chlorhexidine  15 mL Mouth Rinse BID  . escitalopram  20 mg Oral Q lunch  . finasteride  5 mg Oral Q supper  . levofloxacin (LEVAQUIN) IV  750 mg Intravenous Q48H  . nicotine  21 mg Transdermal Daily  . pravastatin  10 mg Oral QPM  . sodium chloride  3 mL Intravenous Q12H  . 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 On xarelto 20mg  daily for chronic afib which has been held on admission and switched to Lovenox due to NPO status. To resume Xarelto today now that tolerating PO meds.  Last Lovenox dose at 10am  Today, 06/21/2014:  Weight: 67.9 kg  SCr 1.12, (5/27) CrCl 76ml/min  Hgb wnl; Plt low but recovering and now > 100k  No bleeding reported  Tolerating diet.   Noted plans for hospice  Goal of Therapy:  Prevention of thromboembolism  Plan:   Lovenox discontinued by MD  Xarelto 15 mg PO tonight at 8p.  Recommended administration is with meals, but when converting from [place] to Xarelto, want to avoid starting more than 2 hrs before next scheduled Lovenox dose would have been due (10p)  Note  that patient took 20 mg prior to admission.  Renal function borderline, appears baseline is now around 50 ml/min or less, which is adjustment threshold for Xarelto for AFib.  Given age and relatively low weight, will opt for lower daily dose.  Would also give consideration to continuing lower dose at discharge if renal function does not appear to improve significantly.  Monitor renal function and adjust if needed  Follow CBC, signs/symptoms of bleeding   Reuel Boom, PharmD Pager: [phone] 06/21/2014, 1:00 PM

## 2014-06-21 NOTE — Progress Notes (Signed)
ANTICOAGULATION CONSULT NOTE - Initial Consult  Pharmacy Consult for Lovenox Indication: atrial fibrillation  No Known Allergies  Patient Measurements: Height: 6\' 3"  (190.5 cm) Weight: 149 lb 11.1 oz (67.9 kg) IBW/kg (Calculated) : 84.5  Vital Signs: Temp: 97.6 F (36.4 C) (05/30 0709) Temp Source: Oral (05/30 0709) BP: 113/85 mmHg (05/30 0709) Pulse Rate: 91 (05/30 0709)  Labs: No results for input(s): HGB, HCT, PLT, APTT, LABPROT, INR, HEPARINUNFRC, CREATININE, CKTOTAL, CKMB, TROPONINI in the last 72 hours.  Estimated Creatinine Clearance: 49.7 mL/min (by C-G formula based on Cr of 1.12).  Medical History: Past Medical History  Diagnosis Date  . Hypertension   . Hernia     L groin   . Hyperlipidemia   . Pre-diabetes   . A-fib   . Stroke     X2  . Urinary retention   . Protein calorie malnutrition   . GI bleed June 2014  . BPH (benign prostatic hyperplasia)   . COPD (chronic obstructive pulmonary disease)   . Anxiety disorder   . Dysrhythmia     afib with stroke  . Cancer    Medications:  Scheduled:  . antiseptic oral rinse  7 mL Mouth Rinse q12n4p  . chlorhexidine  15 mL Mouth Rinse BID  . enoxaparin (LOVENOX) injection  65 mg Subcutaneous Q12H  . escitalopram  20 mg Oral Q lunch  . finasteride  5 mg Oral Q supper  . levofloxacin (LEVAQUIN) IV  750 mg Intravenous Q48H  . nicotine  21 mg Transdermal Daily  . pravastatin  10 mg Oral QPM  . sodium chloride  3 mL Intravenous Q12H  . 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 On xarelto 20mg  daily for chronic afib which has been held on admission due to NPO status.  Pharmacy has been consulted to dose lovenox while off xarelto.  Last labs 5/27, Lovenox continued, no sign of bleed  Pall care consult 5/28  Goal of Therapy:   Anti-Xa level 0.6-1 units/ml 4hrs after LMWH dose given Monitor platelets by anticoagulation protocol: Yes   Plan:   Cont Lovenox 65mg  SQ q12h  Plan home with Hospice, may resume Xarelto on discharge  Minda Ditto PharmD Pager [phone] 06/21/2014, 8:07 AM

## 2014-06-21 NOTE — Progress Notes (Addendum)
Patient ID: Shannon Kirby, male   DOB: 01-15-1932, 81 y.o.   MRN: [id number] [facility] PROGRESS NOTE  Shannon Kirby VVO:[id number] DOB: 1931/09/15 DOA: 2014/06/22 PCP: [name], MD  Brief narrative:    81 y.o. male with past medical history of metastatic soft tissue sarcoma, has declined chemotherapy. He presented to [facility] long hospital because of progressive decline and failure to thrive.   On admission, patient was hemodynamically stable. His blood work was notable for mild thrombocytopenia of 97 otherwise unremarkable. He was found to have urinary tract infection based on urinalysis and he was started empirically on IV Rocephin. His CXR was however notable for possible pneumonia in left lower lobe so antibiotics were changed to Levaquin instead of rocephin.  Barrier to discharge: Would benefit from additional 24 hours in hospital to continue IV Levaquin and participate more with PT. Anticipate discharge by Tuesday, 06/22/2014.   Assessment/Plan:    Principal problem: Generalized weakness / progressive failure to thrive / functional quadriplegia / Dehydration  - Likely secondary to progression of metastatic soft tissue sarcoma. - He feels better today but was unable to fully participate with PT - Diet is dysphagia 3, tolerates well  Active Problems: Urinary tract infection  - Urinalysis on admission showed small leukocytes and few bacteria.  - Urine culture showed no growth on this admission - Initially on rocephin but changed to Levaquin to cover for pneumonia  Diarrhea - C.diff negative - Has had 3 BM in past 24 hours - Added imodium PRN for diarrhea   Severe protein calorie malnutrition - In the context of chronic illness, malignancy. 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  - Chest x-ray on the admission showed left lower lobe airspace consolidation.  - Pt initially on rocephin but this was changed subsequently to Levaquin to cover better for possible aspiration pneumonia.  Thrombocytopenia - Secondary to history of malignancy. - Platelet count stable. No bleeding.   Atrial fibrillation - CHADS vasc score at least 4 (age, HTN, CVA) - On AC with xarelto but changed to Lovenox due to initial presentation of difficulty swallowing.  - Switch to xarelto from today  - Rate controlled while off of cardizem. Cardizem on hold due to borderline soft BP  Stage 2 pressure ulcer - Left hip, partial thickness loss of dermis presenting as a shallow open ulcer with a red, pink wound bed without slough.   DVT Prophylaxis  - On full dose AC with Lovenox subQ   Code Status: DNR/DNI Family Communication: plan of care discussed with the patient's sons at the bedside on the daily basis  Disposition Plan: Anticipate D/C by 06/22/2014.   IV access:  Peripheral IV  Procedures and diagnostic studies:    Dg Chest Port 1 View 06-22-2014 Left lower lobe airspace consolidation. Evidence of metastatic disease with multiple pulmonary nodular lesions and bony metastases. These lesions are better seen on CT than radiography, particularly the bony metastatic lesions.   Medical Consultants:  Palliative care  Other Consultants:  PT Nutrition SLP  IAnti-Infectives:   Rocephin 2014-06-22 --> 06/18/2014 Levaquin at 06/18/2014 -->   Leisa Lenz, MD  [facility] Pager [phone]  Time spent in minutes: 15 minutes  If 7PM-7AM, please contact night-coverage [email or web address] Password [email or web address] 06/21/2014, 11:25 AM  LOS: 4 days    HPI/Subjective: No acute overnight events. Patient reports having diarrhea.   Objective: Filed Vitals:   06/19/14 2130 06/20/14 0613 06/20/14 1530 06/21/14 0709  BP:  115/80 124/86 102/63 113/85  Pulse: 100 61 71 91  Temp: 98 F (36.7 C) 97.8 F (36.6 C) 97.6 F (36.4 C) 97.6 F (36.4 C)  TempSrc: Oral Oral Oral Oral  Resp: 18 18 18 17   Height:      Weight:      SpO2: 94% 94% 96% 97%    Intake/Output Summary (Last 24 hours) at 06/21/14 1125 Last data filed at 06/21/14 0700  Gross per 24 hour  Intake    250 ml  Output    525 ml  Net   -275 ml    Exam:   General:  Pt is more alert, hold good conversation, no distress  Cardiovascular: Irregular rhythm, appreciate S1,S2  Respiratory: Bilateral air entry, no wheezing   Abdomen: non tender abdomen, non distended, (+) BS  Extremities: Pulses palpable, no swelling    Neuro: No focal deficits   Data Reviewed: Basic Metabolic Panel:  Recent Labs Lab 06/17/14 0857 06/18/14 0538  NA 136 139  K 4.3 5.0  CL 100* 102  CO2 25 27  GLUCOSE 133* 82  BUN 27* 29*  CREATININE 1.18 1.12  CALCIUM 8.8* 8.6*   Liver Function Tests:  Recent Labs Lab 06/17/14 0857 06/18/14 0538  AST 84* 69*  ALT 52 42  ALKPHOS 324* 287*  BILITOT 7.4* 8.2*  PROT 6.3* 5.8*  ALBUMIN 2.7* 2.5*   No results for input(s): LIPASE, AMYLASE in the last 168 hours. No results for input(s): AMMONIA in the last 168 hours. CBC:  Recent Labs Lab 06/17/14 0857 06/18/14 0538  WBC 8.0 8.1  NEUTROABS 6.2  --   HGB 16.5 15.6  HCT 46.8 44.6  MCV 97.5 97.6  PLT 97* 118*   Cardiac Enzymes:  Recent Labs Lab 06/17/14 0857  TROPONINI <0.03   BNP: Invalid input(s): POCBNP CBG: No results for input(s): GLUCAP in the last 168 hours.  Urine culture     Status: None   Collection Time: 06/17/14 12:42 PM  Result Value Ref Range Status   Specimen Description IN/OUT CATH URINE  Final   Special Requests NONE  Final   Culture NO GROWTH Performed at [facility]   Final   Report Status 06/18/2014 FINAL  Final  Clostridium Difficile by PCR     Status: None   Collection Time: 06/19/14 11:50 AM  Result  Value Ref Range Status   C difficile by pcr NEGATIVE NEGATIVE Final     Scheduled Meds: . enoxaparin (LOVENOX)   65 mg Subcutaneous Q12H  . escitalopram  20 mg Oral Q lunch  . finasteride  5 mg Oral Q supper  . levofloxacin   750 mg Intravenous Q48H  . nicotine  21 mg Transdermal Daily  . pravastatin  10 mg Oral QPM  . tamsulosin  0.4 mg Oral q morning - 10a

## 2014-06-22 DIAGNOSIS — J690 Pneumonitis due to inhalation of food and vomit: Secondary | ICD-10-CM | POA: Insufficient documentation

## 2014-06-22 DIAGNOSIS — R531 Weakness: Secondary | ICD-10-CM | POA: Insufficient documentation

## 2014-06-22 MED ORDER — ALPRAZOLAM 1 MG PO TABS: 0.5000 mg | ORAL_TABLET | Freq: Three times a day (TID) | ORAL | Status: DC

## 2014-06-22 MED ORDER — ALPRAZOLAM 0.5 MG PO TABS
0.5000 mg | ORAL_TABLET | Freq: Three times a day (TID) | ORAL | Status: DC
  Administered 2014-06-22: 0.5 mg via ORAL
  Filled 2014-06-22: qty 1

## 2014-06-22 MED ORDER — LEVOFLOXACIN 500 MG PO TABS
500.0000 mg | ORAL_TABLET | Freq: Every day | ORAL | Status: DC
  Administered 2014-06-22: 500 mg via ORAL
  Filled 2014-06-22: qty 1

## 2014-06-22 MED ORDER — CETYLPYRIDINIUM CHLORIDE 0.05 % MT LIQD: 7.0000 mL | Freq: Two times a day (BID) | OROMUCOSAL | Status: AC

## 2014-06-22 MED ORDER — LEVOFLOXACIN 750 MG PO TABS
750.0000 mg | ORAL_TABLET | ORAL | Status: DC
  Filled 2014-06-22: qty 1

## 2014-06-22 MED ORDER — LOPERAMIDE HCL 1 MG/5ML PO LIQD: 1.0000 mg | Freq: Four times a day (QID) | ORAL | Status: AC | PRN

## 2014-06-22 MED ORDER — LEVOFLOXACIN 500 MG PO TABS: 500.0000 mg | ORAL_TABLET | Freq: Every day | ORAL | Status: AC

## 2014-06-22 MED ORDER — ALPRAZOLAM 1 MG PO TABS: 1.0000 mg | ORAL_TABLET | Freq: Every day | ORAL | Status: DC

## 2014-06-22 MED ORDER — ESCITALOPRAM OXALATE 20 MG PO TABS: 20.0000 mg | ORAL_TABLET | Freq: Every day | ORAL | Status: AC

## 2014-06-22 MED ORDER — CHLORHEXIDINE GLUCONATE 0.12 % MT SOLN: 15.0000 mL | Freq: Two times a day (BID) | OROMUCOSAL | Status: AC

## 2014-06-22 NOTE — Care Management Note (Signed)
Case Management Note  Patient Details  Name: Shannon Kirby MRN: [id number] Date of Birth: [birth year]  Subjective/Objective:    81 yo male admitted with UTI from home with daughter.                Action/Plan:  Spoke with patient's sons at bedside and they stated that the plan was for the patient to discharge home today with hospice services but the patient's daughter, who is the caregiver, is unable to care for the patient in the home due to her own medical problems. The plan is now for the patient to be discharged to a SNF with Palliative care services in place. Spoke with CSW, Claiborne Billings, and updated on plan for discharge today and to follow up with patient's son Audry Pili [phone].  Expected Discharge Date:   (UNKNOWN)               Expected Discharge Plan:  Skilled Nursing Facility  In-House Referral:  Clinical Social Work  Discharge planning Services  CM Consult, [place]  Post Acute Care Choice:  NA Choice offered to:  NA  DME Arranged:    DME Agency:     HH Arranged:    HH Agency:     Status of Service:  In process, will continue to follow  Medicare Important Message Given:  Yes Date Medicare IM Given:  06/22/14 Medicare IM give by:  Leanne Chang  Date Additional Medicare IM Given:    Additional Medicare Important Message give by:     If discussed at [place] of Stay Meetings, dates discussed:    Additional Comments:  Scot Dock, RN 06/22/2014, 11:30 AM

## 2014-06-22 NOTE — Discharge Summary (Signed)
Physician Discharge Summary  Shannon Kirby MGQ:[id number] DOB: 02/11/1931 DOA: 06/17/2014  PCP: Shannon Pac, MD  Admit date: 06/17/2014 Discharge date: 06/22/2014  Recommendations for Outpatient Follow-up:  1. Please have palliative care services follow up the pt in SNF. 2. Continue Levaquin for pneumonia as prescribed  Discharge Diagnoses:  Active Problems:   UTI (urinary tract infection)   FTT (failure to thrive) in adult   Palliative care encounter   Pressure ulcer   Discharge Condition: stable   Diet recommendation: as tolerated   History of present illness:  81 y.o. male with past medical history of metastatic soft tissue sarcoma, has declined chemotherapy. He presented to [facility] long hospital because of progressive decline and failure to thrive.   On admission, patient was hemodynamically stable. His blood work was notable for mild thrombocytopenia of 97 otherwise unremarkable. He was found to have urinary tract infection based on urinalysis and he was started empirically on IV Rocephin. His CXR was however notable for possible pneumonia in left lower lobe so antibiotics were changed to Levaquin instead of rocephin.   Hospital Course:   Assessment/Plan:    Principal problem: Generalized weakness / progressive failure to thrive / functional quadriplegia / Dehydration  - Likely secondary to progression of metastatic soft tissue sarcoma. - Patient was seen and evaluated by physical therapy. Physical therapy recommended skilled nursing facility placement and family is agreeable to this. - Diet is dysphagia 3, tolerates well  Active Problems: Urinary tract infection  - Urinalysis on admission showed small leukocytes and few bacteria.  - Urine culture showed no growth on this admission - Patient was initially on Rocephin but this was changed to Levaquin to cover for pneumonia.  Diarrhea - C.diff negative - Diarrhea controlled with Imodium  Severe protein calorie  malnutrition - In the context of chronic illness, malignancy. 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 - Patient was on Levaquin which we will changed to by mouth regimen prior to discharge for additional 5 days.  Thrombocytopenia - Secondary to history of malignancy. - Platelets stable. No reports of bleeding. Did not require transfusion throughout the hospital stay.  Atrial fibrillation - CHADS vasc score at least 4 (age, HTN, CVA) - On AC with xarelto but changed to Lovenox due to initial presentation of difficulty swallowing.  - Switched to xarelto 5/30 - Heart rate was in 60s and blood pressure was in 110 range so Cardizem was on hold. Today heart rate is 127 and blood pressure is 115/86. We will resume Cardizem on discharge.  Stage 2 pressure ulcer - Left hip, partial thickness loss of dermis presenting as a shallow open ulcer with a red, pink wound bed without slough.   DVT Prophylaxis  - On full dose anticoagulation with Xarelto    Code Status: DNR/DNI Family Communication: plan of care discussed with the patient's sons at the bedside on the daily by 4. asis     IV access:  Peripheral IV  Procedures and diagnostic studies:   Dg Chest Port 1 View 06/17/2014 Left lower lobe airspace consolidation. Evidence of metastatic disease with multiple pulmonary nodular lesions and bony metastases. These lesions are better seen on CT than radiography, particularly the bony metastatic lesions.   Medical  Consultants:  Palliative care  Other Consultants:  PT Nutrition SLP  IAnti-Infectives:   Rocephin 06/17/2014 --> 06/18/2014 Levaquin at  06/18/2014 --> for 5 days on discharge    Signed:  Leisa Lenz, MD  [facility] 06/22/2014, 10:45 AM  Pager #: [phone]  Time spent in minutes: more than 30 minutes   Discharge Exam: Filed Vitals:   06/22/14 0501  BP: 115/86  Pulse: 127  Temp: 98.1 F (36.7 C)  Resp: 16   Filed Vitals:   06/21/14 0709 06/21/14 1313 06/21/14 2117 06/22/14 0501  BP: 113/85 115/70 132/82 115/86  Pulse: 91 86 86 127  Temp: 97.6 F (36.4 C) 97.9 F (36.6 C) 98.4 F (36.9 C) 98.1 F (36.7 C)  TempSrc: Oral Oral Oral Oral  Resp: 17 18 18 16   Height:      Weight:      SpO2: 97% 95% 95% 94%    General: Pt is alert, follows commands appropriately, not in acute distress Cardiovascular: Regular rate and rhythm, S1/S2 (+) Respiratory: Clear to auscultation bilaterally, no wheezing, no crackles, no rhonchi Abdominal: Soft, non tender, non distended, bowel sounds +, no guarding Extremities: no edema, no cyanosis, pulses palpable bilaterally DP and PT Neuro: Grossly nonfocal  Discharge Instructions  Discharge Instructions    Call MD for:  difficulty breathing, headache or visual disturbances    Complete by:  As directed      Call MD for:  persistant nausea and vomiting    Complete by:  As directed      Call MD for:  severe uncontrolled pain    Complete by:  As directed      Diet - low sodium heart healthy    Complete by:  As directed      Increase activity slowly    Complete by:  As directed             Medication List    STOP taking these medications        polyethylene glycol packet  Commonly known as:  MIRALAX / GLYCOLAX      TAKE these medications        ALPRAZolam 1 MG tablet  Commonly known as:  XANAX  Take 0.5-1 tablets (0.5-1 mg total) by mouth 4 (four) times daily -  with meals and at bedtime. Take half of a table three times a day and a whole tablet at bedtime.     antiseptic oral rinse 0.05 % Liqd solution  Commonly known as:  CPC / CETYLPYRIDINIUM  CHLORIDE 0.05%  7 mLs by Mouth Rinse route 2 times daily at 12 noon and 4 pm.     chlorhexidine 0.12 % solution  Commonly known as:  PERIDEX  15 mLs by Mouth Rinse route 2 (two) times daily.     diltiazem 120 MG 24 hr capsule  Commonly known as:  CARDIZEM CD  Take 1 capsule (120 mg total) by mouth daily.     escitalopram 20 MG tablet  Commonly known as:  LEXAPRO  Take 1 tablet (20 mg total) by mouth daily with lunch.     feeding supplement (ENSURE COMPLETE) Liqd  Take 237 mLs by mouth 2 (two) times daily between meals.     finasteride 5 MG tablet  Commonly known as:  PROSCAR  Take 5 mg by mouth daily with supper.     levofloxacin 500 MG tablet  Commonly known as:  LEVAQUIN  Take 1 tablet (500 mg total) by mouth daily.  loperamide 1 MG/5ML solution  Commonly known as:  IMODIUM  Take 5 mLs (1 mg total) by mouth 4 (four) times daily as needed for diarrhea or loose stools.     pravastatin 10 MG tablet  Commonly known as:  PRAVACHOL  Take 10 mg by mouth every evening.     tamsulosin 0.4 MG Caps capsule  Commonly known as:  FLOMAX  Take 0.4 mg by mouth every morning.     XARELTO 20 MG Tabs tablet  Generic drug:  rivaroxaban  Take 20 mg by mouth daily at 12 noon.           Follow-up Information    Follow up with Shannon Pac, MD. Schedule an appointment as soon as possible for a visit in 1 week.   Specialty:  Family Medicine   Why:  Follow up appt after recent hospitalization   Contact information:   [place] [id number] [phone]        The results of significant diagnostics from this hospitalization (including imaging, microbiology, ancillary and laboratory) are listed below for reference.    Significant Diagnostic Studies: Dg Chest Port 1 View 06/17/2014  Left lower lobe airspace consolidation. Evidence of metastatic disease with multiple pulmonary nodular lesions and bony metastases. These lesions are better seen on CT than  radiography, particularly the bony metastatic lesions.   Electronically Signed   By: Lowella Grip III M.D.   On: 06/17/2014 08:57    Microbiology: Urine culture     Status: None   Collection Time: 06/17/14 12:42 PM  Result Value Ref Range Status   Specimen Description IN/OUT CATH URINE  Final   Special Requests NONE  Final   Culture NO GROWTH   Final   Report Status 06/18/2014 FINAL  Final  Clostridium Difficile by PCR     Status: None   Collection Time: 06/19/14 11:50 AM  Result Value Ref Range Status   C difficile by pcr NEGATIVE NEGATIVE Final     Labs: Basic Metabolic Panel:  Recent Labs Lab 06/17/14 0857 06/18/14 0538  NA 136 139  K 4.3 5.0  CL 100* 102  CO2 25 27  GLUCOSE 133* 82  BUN 27* 29*  CREATININE 1.18 1.12  CALCIUM 8.8* 8.6*   Liver Function Tests:  Recent Labs Lab 06/17/14 0857 06/18/14 0538  AST 84* 69*  ALT 52 42  ALKPHOS 324* 287*  BILITOT 7.4* 8.2*  PROT 6.3* 5.8*  ALBUMIN 2.7* 2.5*   No results for input(s): LIPASE, AMYLASE in the last 168 hours. No results for input(s): AMMONIA in the last 168 hours. CBC:  Recent Labs Lab 06/17/14 0857 06/18/14 0538  WBC 8.0 8.1  NEUTROABS 6.2  --   HGB 16.5 15.6  HCT 46.8 44.6  MCV 97.5 97.6  PLT 97* 118*   Cardiac Enzymes:  Recent Labs Lab 06/17/14 0857  TROPONINI <0.03   BNP: BNP (last 3 results)  Recent Labs  06/17/14 0857  BNP 269.2*    ProBNP (last 3 results) No results for input(s): PROBNP in the last 8760 hours.  CBG: No results for input(s): GLUCAP in the last 168 hours.

## 2014-06-22 NOTE — Progress Notes (Signed)
Called report to [place] at [facility]. Left number in case she had additional questions.

## 2014-06-22 NOTE — Progress Notes (Signed)
Physical Therapy Treatment Patient Details Name: Shannon Kirby MRN: [id number] DOB: 1932/01/19 Today's Date: 06/22/2014    History of Present Illness With metastatic soft tissue sarcoma declined chemotherapy, electively to receive supportive care was brought to ER by family due to progressive weakness. Per family, patient has been largely bedbound in the past few months, but was able to ambulate a few steps with a walker in the past. However, the last few days, he was not able to ambulate, also developed difficulty swallowing, patient denies pain, no fever, no n/v.     PT Comments    Pt required MAX encouragement to participate.  Low motivation and repeats "I'm weak". Pt actually performed well to stand and take a few side steps to recliner.    Follow Up Recommendations  SNF (per chart review, daughter is unable to provide appropriate care therefore pt will need SNF.  Will consult and update LPT.)     Equipment Recommendations       Recommendations for Other Services       Precautions / Restrictions Precautions Precautions: Fall Restrictions Weight Bearing Restrictions: No    Mobility  Bed Mobility Overal bed mobility: Needs Assistance Bed Mobility: Supine to Sit     Supine to sit: Mod assist     General bed mobility comments: use of rail, support at trunk plus MAX encouragement  Transfers Overall transfer level: Needs assistance Equipment used: None Transfers: Stand Pivot Transfers   Stand pivot transfers: Mod assist;Min assist       General transfer comment: pt stated he was too weak to stans so assisted from bed to recliner 1/4 turn "[employer] tech.  Pt was able to stand and support his own body weight and he was able to weight shift and take a few side steps.    Ambulation/Gait             General Gait Details: Pt declined to attempt.  Based on transfer performance,  pt demonstartes the ability.     Stairs            Wheelchair Mobility     Modified Rankin (Stroke Patients Only)       Balance                                    Cognition Arousal/Alertness: Awake/alert Behavior During Therapy: WFL for tasks assessed/performed Overall Cognitive Status: Within Functional Limits for tasks assessed                      Exercises      General Comments        Pertinent Vitals/Pain Pain Assessment: No/denies pain    Home Living                      Prior Function            PT Goals (current goals can now be found in the care plan section) Progress towards PT goals: Progressing toward goals    Frequency  Min 3X/week    PT Plan      Co-evaluation             End of Session Equipment Utilized During Treatment: Gait belt Activity Tolerance: Patient tolerated treatment well Patient left: in chair;with call bell/phone within reach;with chair alarm set;with family/visitor present     Time: 0945-1010 PT Time Calculation (min) (ACUTE  ONLY): 25 min  Charges:  $Therapeutic Activity: 23-37 mins                    G Codes:      Rica Koyanagi  PTA [facility]  Acute  Rehab Pager      [phone]

## 2014-06-22 NOTE — Clinical Social Work Placement (Signed)
CSW received consult for SNF placement from [facility], Seth Bake. CSW spoke with patient's son, Audry Pili (ph#: [phone]) and confirmed plan for SNF, requesting [facility]. CSW confirmed with Ivin Booty at [facility] that they would be able to offer a bed.   Patient is set to discharge to [facility] today. Patient & son, Audry Pili aware. Discharge packet given to RN, Kirstin. [facility] will be called for transport when ready.     Raynaldo Opitz, [facility] Clinical Social Worker cell #: [phone]     CLINICAL SOCIAL WORK PLACEMENT  NOTE  Date:  06/22/2014  Patient Details  Name: Shannon Kirby MRN: [id number] Date of Birth: 1931-05-22  Clinical Social Work is seeking post-discharge placement for this patient at the [place] level of care (*CSW will initial, date and re-position this form in  chart as items are completed):  Yes   Patient/family provided with [facility] Work Department's list of facilities offering this level of care within the geographic area requested by the patient (or if unable, by the patient's family).  Yes   Patient/family informed of their freedom to choose among providers that offer the needed level of care, that participate in Medicare, Medicaid or managed care program needed by the patient, have an available bed and are willing to accept the patient.  Yes   Patient/family informed of [facility]'s ownership interest in [facility] and [facility], [facility], [facility], as well as of the fact that they are under no obligation to receive care at these facilities.  PASRR submitted to EDS on 06/22/14     PASRR number received on 06/22/14     Existing PASRR number confirmed on       FL2 transmitted to all facilities in geographic area requested by pt/family on 06/22/14     FL2 transmitted to all facilities within larger geographic area on       Patient informed that his/her managed care company has contracts with or will negotiate with  certain facilities, including the following:        Yes   Patient/family informed of bed offers received.  Patient chooses bed at [facility]     Physician recommends and patient chooses bed at      Patient to be transferred to [facility] on 06/22/14.  Patient to be transferred to facility by [facility]     Patient family notified on 06/22/14 of transfer.  Name of family member notified:  patient's son, Audry Pili at bedside     PHYSICIAN       Additional Comment:    _______________________________________________ Standley Brooking, [place] 06/22/2014, 12:10 PM

## 2014-06-22 NOTE — Discharge Instructions (Signed)

## 2014-06-22 NOTE — Progress Notes (Signed)
ANTIBIOTIC CONSULT NOTE - FOLLOW UP  Pharmacy Consult for Levaquin Indication: PNA / UTI  No Known Allergies  Patient Measurements: Height: 6\' 3"  (190.5 cm) Weight: 149 lb 11.1 oz (67.9 kg) IBW/kg (Calculated) : 84.5 Adjusted Body Weight:   Vital Signs: Temp: 98.1 F (36.7 C) (05/31 0501) Temp Source: Oral (05/31 0501) BP: 115/86 mmHg (05/31 0501) Pulse Rate: 127 (05/31 0501) Intake/Output from previous day: 05/30 0701 - 05/31 0700 In: -  Out: 650 [Urine:650] Intake/Output from this shift:    Labs: No results for input(s): WBC, HGB, PLT, LABCREA, CREATININE in the last 72 hours. Estimated Creatinine Clearance: 49.7 mL/min (by C-G formula based on Cr of 1.12). No results for input(s): VANCOTROUGH, VANCOPEAK, VANCORANDOM, GENTTROUGH, GENTPEAK, GENTRANDOM, TOBRATROUGH, TOBRAPEAK, TOBRARND, AMIKACINPEAK, AMIKACINTROU, AMIKACIN in the last 72 hours.   Microbiology: Recent Results (from the past 720 hour(s))  Urine culture     Status: None   Collection Time: 06/17/14 12:42 PM  Result Value Ref Range Status   Specimen Description IN/OUT CATH URINE  Final   Special Requests NONE  Final   Colony Count NO GROWTH Performed at [facility]   Final   Culture NO GROWTH Performed at [facility]   Final   Report Status 06/18/2014 FINAL  Final  Clostridium Difficile by PCR     Status: None   Collection Time: 06/19/14 11:50 AM  Result Value Ref Range Status   C difficile by pcr NEGATIVE NEGATIVE Final    Anti-infectives    Start     Dose/Rate Route Frequency Ordered Stop   06/22/14 1200  levofloxacin (LEVAQUIN) tablet 750 mg     750 mg Oral Every 48 hours 06/22/14 0940     06/18/14 1400  levofloxacin (LEVAQUIN) IVPB 750 mg  Status:  Discontinued     750 mg 100 mL/hr over 90 Minutes Intravenous Every 48 hours 06/18/14 1205 06/22/14 0939   06/18/14 1200  cefTRIAXone (ROCEPHIN) 1 g in dextrose 5 % 50 mL IVPB - Premix  Status:  Discontinued     1 g 100 mL/hr  over 30 Minutes Intravenous Every 24 hours 06/17/14 1617 06/18/14 1126   06/17/14 1245  cefTRIAXone (ROCEPHIN) 1 g in dextrose 5 % 50 mL IVPB     1 g 100 mL/hr over 30 Minutes Intravenous  Once 06/17/14 1239 06/17/14 1421      Assessment: 25 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  Pt started on ceftriaxone for possible UTI initially, then changed to levaquin as CXR showed LLL pneumonia.    5/31: D4 antibiotics.  Per RN, IV came out, no plans to put back.  May d/c today if hospital bed arrives at home. Currently AF, WBCs WNL. Renal function stable ~50 ml/min.   5/26 >> Ceftriaxone >> 5/27 5/27 >> Levaquin >>  5/26 urine: NGF  Goal of Therapy:  Eradication of infection  Plan:  Change to levaquin 750mg  PO q48h  Ralene Bathe, PharmD, BCPS 06/22/2014, 9:45 AM  Pager: [phone]

## 2014-06-24 ENCOUNTER — Encounter: Payer: Self-pay | Admitting: Adult Health

## 2014-06-24 ENCOUNTER — Non-Acute Institutional Stay: Payer: Commercial Managed Care - HMO | Admitting: Adult Health

## 2014-06-24 DIAGNOSIS — F419 Anxiety disorder, unspecified: Secondary | ICD-10-CM | POA: Diagnosis not present

## 2014-06-24 DIAGNOSIS — R531 Weakness: Secondary | ICD-10-CM | POA: Diagnosis not present

## 2014-06-24 DIAGNOSIS — I4891 Unspecified atrial fibrillation: Secondary | ICD-10-CM

## 2014-06-24 DIAGNOSIS — F329 Major depressive disorder, single episode, unspecified: Secondary | ICD-10-CM | POA: Diagnosis not present

## 2014-06-24 DIAGNOSIS — J690 Pneumonitis due to inhalation of food and vomit: Secondary | ICD-10-CM | POA: Diagnosis not present

## 2014-06-24 DIAGNOSIS — D696 Thrombocytopenia, unspecified: Secondary | ICD-10-CM

## 2014-06-24 DIAGNOSIS — R627 Adult failure to thrive: Secondary | ICD-10-CM

## 2014-06-24 DIAGNOSIS — E43 Unspecified severe protein-calorie malnutrition: Secondary | ICD-10-CM

## 2014-06-24 DIAGNOSIS — N400 Enlarged prostate without lower urinary tract symptoms: Secondary | ICD-10-CM | POA: Diagnosis not present

## 2014-06-24 DIAGNOSIS — C499 Malignant neoplasm of connective and soft tissue, unspecified: Secondary | ICD-10-CM | POA: Diagnosis not present

## 2014-06-24 DIAGNOSIS — R197 Diarrhea, unspecified: Secondary | ICD-10-CM

## 2014-06-24 DIAGNOSIS — L8990 Pressure ulcer of unspecified site, unspecified stage: Secondary | ICD-10-CM | POA: Diagnosis not present

## 2014-06-24 DIAGNOSIS — F32A Depression, unspecified: Secondary | ICD-10-CM

## 2014-06-24 NOTE — Progress Notes (Signed)
Patient ID: Shannon Kirby, male   DOB: Apr 18, 1931, 81 y.o.   MRN: [id number]   06/24/2014  Facility:  Nursing Home Location:  [place] Room Number: 034-9 LEVEL OF CARE:  SNF (31)    Chief Complaint  Patient presents with  . Hospitalization Follow-up    Generalized weakness, pneumonia, atrial fibrillation, failure to thrive, anxiety, depression, BPH, thrombocytopenia, diarrhea, protein calorie malnutrition, metastatic soft tissue sarcoma and stage II pressure ulcer    HISTORY OF PRESENT ILLNESS:  This is an 81 year old male who has been admitted to [facility] on 06/22/14 from [facility]. He has PMH of metastatic soft tissue sarcoma and has declined chemotherapy. He presented to ED with progressive decline and failure to thrive. Chest x-ray was noted to have pneumonia in the left lower lobe and was started on Levaquin.  He has been admitted for a short-term rehabilitation.  PAST MEDICAL HISTORY:  Past Medical History  Diagnosis Date  . Hypertension   . Hernia     L groin   . Hyperlipidemia   . Pre-diabetes   . A-fib   . Stroke     X2  . Urinary retention   . Protein calorie malnutrition   . GI bleed June 2014  . BPH (benign prostatic hyperplasia)   . COPD (chronic obstructive pulmonary disease)   . Anxiety disorder   . Dysrhythmia     afib with stroke  . Cancer     CURRENT MEDICATIONS: Reviewed per MAR/see medication list  No Known Allergies   REVIEW OF SYSTEMS:  GENERAL: no change in appetite, no fatigue, no weight changes, no fever, chills or weakness RESPIRATORY: no cough, SOB, DOE, wheezing, hemoptysis CARDIAC: no chest pain, or palpitations GI: no abdominal pain, diarrhea, constipation, heart burn, nausea or vomiting  PHYSICAL EXAMINATION  GENERAL: no acute distress, normal body habitus SKIN:  Pressure ulcer on left hip, stage 2 EYES: conjunctivae normal, sclerae normal, normal eye lids NECK: supple, trachea midline,  no neck masses, no thyroid tenderness, no thyromegaly LYMPHATICS: no LAN in the neck, no supraclavicular LAN RESPIRATORY: breathing is even & unlabored, BS CTAB CARDIAC: RRR, no murmur,no extra heart sounds, no edema GI: abdomen soft, normal BS, no masses, no tenderness, no hepatomegaly, no splenomegaly EXTREMITIES:  Able to move X 4 extremities but has generalized weakness of BLE PSYCHIATRIC: the patient is alert & oriented to person, affect & behavior appropriate  LABS/RADIOLOGY: Labs reviewed: Basic Metabolic Panel:  Recent Labs  05/14/14 1130 06/17/14 0857 06/18/14 0538  NA 136 136 139  K 4.4 4.3 5.0  CL 102 100* 102  CO2 23 25 27   GLUCOSE 108* 133* 82  BUN 31* 27* 29*  CREATININE 1.48* 1.18 1.12  CALCIUM 9.3 8.8* 8.6*   Liver Function Tests:  Recent Labs  05/14/14 1130 06/17/14 0857 06/18/14 0538  AST 72* 84* 69*  ALT 53 52 42  ALKPHOS 266* 324* 287*  BILITOT 3.0* 7.4* 8.2*  PROT 6.9 6.3* 5.8*  ALBUMIN 3.6 2.7* 2.5*   CBC:  Recent Labs  05/13/14 0930 05/14/14 1130 06/17/14 0857 06/18/14 0538  WBC 7.4 6.6 8.0 8.1  NEUTROABS 5.1 4.8 6.2  --   HGB 16.9 16.3 16.5 15.6  HCT 49.6 48.6 46.8 44.6  MCV 99.0* 100.2* 97.5 97.6  PLT 107* 102* 97* 118*   Cardiac Enzymes:  Recent Labs  06/17/14 0857  TROPONINI <0.03    Dg Chest Port 1 View  06/17/2014  CLINICAL DATA:  Generalized weakness and soft tissue swelling. History of soft tissue sarcoma.  EXAM: PORTABLE CHEST - 1 VIEW  COMPARISON:  Chest CT May 13, 2014; chest radiograph January 04, 2013  FINDINGS: There is airspace consolidation in the left lower lobe. Multiple pulmonary nodular lesions consistent with metastatic foci are noted, better seen on recent CT. Heart is upper normal in size with pulmonary vascularity within normal limits. There is no appreciable adenopathy. There are scattered lytic bone lesions, better seen on recent CT.  IMPRESSION: Left lower lobe airspace consolidation. Evidence of  metastatic disease with multiple pulmonary nodular lesions and bony metastases. These lesions are better seen on CT than radiography, particularly the bony metastatic lesions.   Electronically Signed   By: Lowella Grip III M.D.   On: 06/17/2014 08:57    ASSESSMENT/PLAN:  Generalized weakness - likely secondary to progression of metastatic soft tissue sarcoma; for rehabilitation Pneumonia - continue Levaquin 500 mg by mouth daily 5 days Failure to thrive in an adult - for palliative consult Protein calorie malnutrition, severe - albumin 2.5; RD consultation Metastatic soft tissue sarcoma - has declined chemotherapy; for palliative consult Atrial fibrillation - continue Xarelto 20 mg by mouth daily and Cardizem 120 mg by mouth daily Pressure ulcer stage II on left hip - continue treatment and skin care Anxiety - mood is stable; continue Xanax 0.5 mg by mouth 3 times a day and 1 mg by mouth daily at bedtime Depression - continue Lexapro 20 mg by mouth daily BPH - continue Proscar 5 mg by mouth daily Thrombocytopenia - platelet 118; will monitor Diarrhea - stable; continue Imodium 1 mg/5 mL give 5 mL by mouth 4 times a day when necessary   Goals of care:  Short-term rehabilitation   Labs/test ordered:   CBC and BMP  Spent 50 minutes in patient care.    [facility], NP [employer] [phone]

## 2014-06-25 ENCOUNTER — Non-Acute Institutional Stay: Payer: Commercial Managed Care - HMO | Admitting: Internal Medicine

## 2014-06-25 DIAGNOSIS — I4891 Unspecified atrial fibrillation: Secondary | ICD-10-CM

## 2014-06-25 DIAGNOSIS — N401 Enlarged prostate with lower urinary tract symptoms: Secondary | ICD-10-CM

## 2014-06-25 DIAGNOSIS — R400 Somnolence: Secondary | ICD-10-CM

## 2014-06-25 DIAGNOSIS — R627 Adult failure to thrive: Secondary | ICD-10-CM | POA: Diagnosis not present

## 2014-06-25 DIAGNOSIS — J690 Pneumonitis due to inhalation of food and vomit: Secondary | ICD-10-CM

## 2014-06-25 DIAGNOSIS — N400 Enlarged prostate without lower urinary tract symptoms: Secondary | ICD-10-CM

## 2014-06-25 DIAGNOSIS — F418 Other specified anxiety disorders: Secondary | ICD-10-CM | POA: Diagnosis not present

## 2014-06-25 DIAGNOSIS — C499 Malignant neoplasm of connective and soft tissue, unspecified: Secondary | ICD-10-CM | POA: Diagnosis not present

## 2014-06-25 DIAGNOSIS — R531 Weakness: Secondary | ICD-10-CM | POA: Diagnosis not present

## 2014-06-25 DIAGNOSIS — E43 Unspecified severe protein-calorie malnutrition: Secondary | ICD-10-CM

## 2014-06-25 DIAGNOSIS — R338 Other retention of urine: Secondary | ICD-10-CM

## 2014-06-25 DIAGNOSIS — L89222 Pressure ulcer of left hip, stage 2: Secondary | ICD-10-CM | POA: Diagnosis not present

## 2014-06-25 NOTE — Progress Notes (Signed)
Patient ID: Shannon Kirby, male   DOB: 1931/10/05, 81 y.o.   MRN: [id number]     [place]  PCP: [name], MD  Code Status: DNR  No Known Allergies  Chief Complaint  Patient presents with  . New Admit To SNF    New Admission      HPI:  81 year old patient is here for short term rehabilitation post hospital admission from failure to thrive, acute respiratory failure from pneumonia, protein calorie malnutrition. He has history of metastatic soft tissue sarcoma and functional quadriplegia. He is here for rehabilitation and goal is to take him home with comfort care. His daughter [name] is present at bedside. He has been more awake and working with therapy lately. He is out of bed. He needs assistance with feeding. He denies any concerns this visit. No new concern from staff.   Review of Systems:  Constitutional: Negative for fever, chills, diaphoresis.  HENT: Negative for headache, congestion Respiratory: Negative for cough, shortness of breath and wheezing.   Cardiovascular: Negative for chest pain, palpitations Gastrointestinal: Negative for heartburn, nausea, vomiting, abdominal pain. Had bowel movement yesterday Genitourinary: has foley catheter  Musculoskeletal: Negative for back pain, falls Skin: Negative for itching, rash.  Neurological: Negative for dizziness, tingling, focal weakness. Positive for generalized weakness Psychiatric/Behavioral: Negative for depression    Past Medical History  Diagnosis Date  . Hypertension   . Hernia     L groin   . Hyperlipidemia   . Pre-diabetes   . A-fib   . Stroke     X2  . Urinary retention   . Protein calorie malnutrition   . GI bleed June 2014  . BPH (benign prostatic hyperplasia)   . COPD (chronic obstructive pulmonary disease)   . Anxiety disorder   . Dysrhythmia     afib with stroke  . Cancer    Past Surgical History  Procedure Laterality Date  . No past surgeries    . Tonsillectomy and  adenoidectomy  age 68  . Colonoscopy N/A 07/13/2012    Procedure: COLONOSCOPY;  Surgeon: Lear Ng, MD;  Location: [facility] ENDOSCOPY;  Service: Endoscopy;  Laterality: N/A;   Social History:   reports that he has been smoking.  He has never used smokeless tobacco. He reports that he does not drink alcohol or use illicit drugs.  Family History  Problem Relation Age of Onset  . Other Mother     Passed at 39 of "blood clot in chest"  . Colon cancer Father     Passed at 50  . Heart attack Sister     Passed away 1  . Dementia Sister     Medications: Patient's Medications  New Prescriptions   No medications on file  Previous Medications   ANTISEPTIC ORAL RINSE (CPC / CETYLPYRIDINIUM CHLORIDE 0.05%) 0.05 % LIQD SOLUTION    7 mLs by Mouth Rinse route 2 times daily at 12 noon and 4 pm.   CHLORHEXIDINE (PERIDEX) 0.12 % SOLUTION    15 mLs by Mouth Rinse route 2 (two) times daily.   DILTIAZEM (CARDIZEM CD) 120 MG 24 HR CAPSULE    Take 1 capsule (120 mg total) by mouth daily.   ESCITALOPRAM (LEXAPRO) 20 MG TABLET    Take 1 tablet (20 mg total) by mouth daily with lunch.   FEEDING SUPPLEMENT (ENSURE COMPLETE) LIQD    Take 237 mLs by mouth 2 (two) times daily between meals.   FINASTERIDE (PROSCAR) 5  MG TABLET    Take 5 mg by mouth daily with supper.    LEVOFLOXACIN (LEVAQUIN) 500 MG TABLET    Take 1 tablet (500 mg total) by mouth daily.   LOPERAMIDE (IMODIUM) 1 MG/5ML SOLUTION    Take 5 mLs (1 mg total) by mouth 4 (four) times daily as needed for diarrhea or loose stools.   PRAVASTATIN (PRAVACHOL) 10 MG TABLET    Take 10 mg by mouth every evening.   RIVAROXABAN (XARELTO) 20 MG TABS TABLET    Take 20 mg by mouth daily at 12 noon.    TAMSULOSIN (FLOMAX) 0.4 MG CAPS CAPSULE    Take 0.4 mg by mouth every morning.   Modified Medications   Modified Medication Previous Medication   ALPRAZOLAM (XANAX) 1 MG TABLET ALPRAZolam (XANAX) 1 MG tablet      1 by mouth three times daily with meals for  anxiety/restless, 1 by mouth at bedtime    Take 0.5-1 tablets (0.5-1 mg total) by mouth 4 (four) times daily -  with meals and at bedtime. Take half of a table three times a day and a whole tablet at bedtime.  Discontinued Medications   No medications on file     Physical Exam: Filed Vitals:   06/25/14 1103  BP: 106/71  Pulse: 72  Temp: 95.1 F (35.1 C)  TempSrc: Oral  Resp: 22  Height: 6' 3.6" (1.92 m)  Weight: 152 lb 12.8 oz (69.31 kg)  SpO2: 97%    General- elderly male, frail, in no acute distress Head- normocephalic, atraumatic Throat- moist mucus membrane Eye- PERRLA, icterus + Neck- no cervical lymphadenopathy Cardiovascular- normal s1,s2, no murmurs, pitting 1+ leg edema Respiratory- bilateral clear to auscultation, no wheeze, no rhonchi, no crackles, no use of accessory muscles Abdomen- bowel sounds present, soft, non tender, ascites present, foley present Musculoskeletal- able to move all 4 extremities, generalized weakness  Neurological- no focal deficit, aao x 3, lethargic and sleepy Skin- warm and dry, left hip stage 2 ulcer Psychiatry- normal mood and affect    Labs reviewed: Basic Metabolic Panel:  Recent Labs  05/14/14 1130 06/17/14 0857 06/18/14 0538  NA 136 136 139  K 4.4 4.3 5.0  CL 102 100* 102  CO2 23 25 27   GLUCOSE 108* 133* 82  BUN 31* 27* 29*  CREATININE 1.48* 1.18 1.12  CALCIUM 9.3 8.8* 8.6*   Liver Function Tests:  Recent Labs  05/14/14 1130 06/17/14 0857 06/18/14 0538  AST 72* 84* 69*  ALT 53 52 42  ALKPHOS 266* 324* 287*  BILITOT 3.0* 7.4* 8.2*  PROT 6.9 6.3* 5.8*  ALBUMIN 3.6 2.7* 2.5*   No results for input(s): LIPASE, AMYLASE in the last 8760 hours. No results for input(s): AMMONIA in the last 8760 hours. CBC:  Recent Labs  05/13/14 0930 05/14/14 1130 06/17/14 0857 06/18/14 0538  WBC 7.4 6.6 8.0 8.1  NEUTROABS 5.1 4.8 6.2  --   HGB 16.9 16.3 16.5 15.6  HCT 49.6 48.6 46.8 44.6  MCV 99.0* 100.2* 97.5 97.6    PLT 107* 102* 97* 118*   Cardiac Enzymes:  Recent Labs  06/17/14 0857  TROPONINI <0.03   BNP: Invalid input(s): POCBNP CBG: No results for input(s): GLUCAP in the last 8760 hours.  Radiological Exams:  Dg Chest Port 1 View  06/17/2014   CLINICAL DATA:  Generalized weakness and soft tissue swelling. History of soft tissue sarcoma.  EXAM: PORTABLE CHEST - 1 VIEW  COMPARISON:  Chest CT May 13, 2014; chest radiograph January 04, 2013  FINDINGS: There is airspace consolidation in the left lower lobe. Multiple pulmonary nodular lesions consistent with metastatic foci are noted, better seen on recent CT. Heart is upper normal in size with pulmonary vascularity within normal limits. There is no appreciable adenopathy. There are scattered lytic bone lesions, better seen on recent CT.  IMPRESSION: Left lower lobe airspace consolidation. Evidence of metastatic disease with multiple pulmonary nodular lesions and bony metastases. These lesions are better seen on CT than radiography, particularly the bony metastatic lesions.   Electronically Signed   By: Lowella Grip III M.D.   On: 06/17/2014 08:57    Assessment/Plan  Generalized weakness  Will have patient work with PT/OT as tolerated to regain strength and restore function.  Fall precautions are in place.  Failure to thrive Palliative care consult, assistance with ADLs to be provided, comfort care is the goal. OOB to chair atleast 2 times a day. To work with therapy team.   Protein calorie malnutrition Will continue medpass with mechanical soft diet, aspiration precautions, monitor weight, assistance with feeding  Aspiration pneumonia Continue and complete course of levaquin and monitor, aspiration precautions  Somnolence Decrease xanax to 0.5 mg bid x 1 day, then 0.5 mg daily for a day and then 0.5 mg daily prn only and reassess.   Stage 2 left hip ulcer Pressure ulcer prophylaxis, continue wound care  Metastatic soft tissue  sarcoma  has declined chemotherapy. Palliative care  BPH with urinary retention Continue foley, add foley strap for comfort while out of bed. Continue proscar for now  Atrial fibrillation Rate controlled. Continue cardizem 120 mg daily with Xarelto 20 mg daily   Depression and anxiety Stable mood. Decrease xanax as above. continue Lexapro 20 mg daily   Goals of care: short term rehabilitation   Labs/tests ordered: cbc, cmp, ammonia  Family/ staff Communication: reviewed care plan with patient and nursing supervisor    Blanchie Serve, MD  [facility] [phone] (Monday-Friday 8 am - 5 pm) [phone] (afterhours)

## 2014-06-29 ENCOUNTER — Ambulatory Visit: Payer: Commercial Managed Care - HMO | Admitting: Oncology

## 2014-07-01 ENCOUNTER — Telehealth: Payer: Self-pay | Admitting: *Deleted

## 2014-07-01 NOTE — Telephone Encounter (Signed)
Voice mail from daughter, Shannon Kirby: Currently in rehab situation and has declined. Asking about getting Hospice referral asap so she can take him home Monday. Will need equipment also.

## 2014-07-02 ENCOUNTER — Encounter: Payer: Self-pay | Admitting: Adult Health

## 2014-07-02 ENCOUNTER — Telehealth: Payer: Self-pay | Admitting: *Deleted

## 2014-07-02 ENCOUNTER — Non-Acute Institutional Stay (SKILLED_NURSING_FACILITY): Payer: Commercial Managed Care - HMO | Admitting: Adult Health

## 2014-07-02 DIAGNOSIS — D696 Thrombocytopenia, unspecified: Secondary | ICD-10-CM

## 2014-07-02 DIAGNOSIS — I4891 Unspecified atrial fibrillation: Secondary | ICD-10-CM

## 2014-07-02 DIAGNOSIS — R531 Weakness: Secondary | ICD-10-CM | POA: Diagnosis not present

## 2014-07-02 DIAGNOSIS — R197 Diarrhea, unspecified: Secondary | ICD-10-CM

## 2014-07-02 DIAGNOSIS — L8990 Pressure ulcer of unspecified site, unspecified stage: Secondary | ICD-10-CM | POA: Diagnosis not present

## 2014-07-02 DIAGNOSIS — F419 Anxiety disorder, unspecified: Secondary | ICD-10-CM | POA: Diagnosis not present

## 2014-07-02 DIAGNOSIS — C499 Malignant neoplasm of connective and soft tissue, unspecified: Secondary | ICD-10-CM

## 2014-07-02 DIAGNOSIS — R627 Adult failure to thrive: Secondary | ICD-10-CM | POA: Diagnosis not present

## 2014-07-02 DIAGNOSIS — E43 Unspecified severe protein-calorie malnutrition: Secondary | ICD-10-CM

## 2014-07-02 DIAGNOSIS — N400 Enlarged prostate without lower urinary tract symptoms: Secondary | ICD-10-CM | POA: Diagnosis not present

## 2014-07-02 DIAGNOSIS — J690 Pneumonitis due to inhalation of food and vomit: Secondary | ICD-10-CM | POA: Diagnosis not present

## 2014-07-02 NOTE — Telephone Encounter (Signed)
Per Ned Card, NP, notified Jocelyn Lamer @ [facility] of [place] of patient referral needed.  Jocelyn Lamer verbalized understanding.

## 2014-07-02 NOTE — Progress Notes (Signed)
Patient ID: Shannon Kirby, male   DOB: 12-24-1931, 81 y.o.   MRN: [id number]   07/02/2014  Facility:  [facility] Location:  [place] Room Number: 782-4 LEVEL OF CARE:  SNF (31)   Chief Complaint  Patient presents with  . Discharge Note    Generalized weakness, pneumonia, atrial fibrillation, failure to thrive, anxiety, depression, BPH, thrombocytopenia, diarrhea, protein calorie malnutrition, metastatic soft tissue sarcoma and stage II pressure ulcer    HISTORY OF PRESENT ILLNESS:  This is an 81 year old male who is for discharge home with Hospice recommendation. He has been admitted to [facility] on 06/22/14 from [facility]. He has PMH of metastatic soft tissue sarcoma and has declined chemotherapy. He presented to ED with progressive decline and failure to thrive. Chest x-ray was noted to have pneumonia in the left lower lobe and was started on Levaquin.  He has finished his Levaquin course.  Patient was admitted to this facility for short-term rehabilitation after the patient's recent hospitalization.    PAST MEDICAL HISTORY:  Past Medical History  Diagnosis Date  . Hypertension   . Hernia     L groin   . Hyperlipidemia   . Pre-diabetes   . A-fib   . Stroke     X2  . Urinary retention   . Protein calorie malnutrition   . GI bleed June 2014  . BPH (benign prostatic hyperplasia)   . COPD (chronic obstructive pulmonary disease)   . Anxiety disorder   . Dysrhythmia     afib with stroke  . Cancer     CURRENT MEDICATIONS: Reviewed per MAR/see medication list  No Known Allergies   REVIEW OF SYSTEMS:  GENERAL:  no fever, chills RESPIRATORY: no cough, SOB, DOE, wheezing, hemoptysis CARDIAC: no chest pain, or palpitations GI: no abdominal pain, diarrhea, constipation, heart burn, nausea or vomiting  PHYSICAL EXAMINATION  GENERAL: no acute distress, normal body habitus SKIN:  Pressure ulcer on left hip, stage 2 NECK: supple,  trachea midline, no neck masses, no thyroid tenderness, no thyromegaly LYMPHATICS: no LAN in the neck, no supraclavicular LAN RESPIRATORY: breathing is even & unlabored, BS CTAB CARDIAC: RRR, no murmur,no extra heart sounds, no edema GI: abdomen soft, normal BS, no masses, no tenderness, no hepatomegaly, no splenomegaly EXTREMITIES:  Able to move X 4 extremities but has generalized weakness of BLE PSYCHIATRIC: the patient is alert & oriented to person, affect & behavior appropriate  LABS/RADIOLOGY: Labs reviewed: Basic Metabolic Panel:  Recent Labs  05/14/14 1130 06/17/14 0857 06/18/14 0538  NA 136 136 139  K 4.4 4.3 5.0  CL 102 100* 102  CO2 23 25 27   GLUCOSE 108* 133* 82  BUN 31* 27* 29*  CREATININE 1.48* 1.18 1.12  CALCIUM 9.3 8.8* 8.6*   Liver Function Tests:  Recent Labs  05/14/14 1130 06/17/14 0857 06/18/14 0538  AST 72* 84* 69*  ALT 53 52 42  ALKPHOS 266* 324* 287*  BILITOT 3.0* 7.4* 8.2*  PROT 6.9 6.3* 5.8*  ALBUMIN 3.6 2.7* 2.5*   CBC:  Recent Labs  05/13/14 0930 05/14/14 1130 06/17/14 0857 06/18/14 0538  WBC 7.4 6.6 8.0 8.1  NEUTROABS 5.1 4.8 6.2  --   HGB 16.9 16.3 16.5 15.6  HCT 49.6 48.6 46.8 44.6  MCV 99.0* 100.2* 97.5 97.6  PLT 107* 102* 97* 118*   Cardiac Enzymes:  Recent Labs  06/17/14 0857  TROPONINI <0.03    Dg Chest [facility] [address]  06/17/2014   CLINICAL DATA:  Generalized weakness and soft tissue swelling. History of soft tissue sarcoma.  EXAM: PORTABLE CHEST - 1 VIEW  COMPARISON:  Chest CT May 13, 2014; chest radiograph January 04, 2013  FINDINGS: There is airspace consolidation in the left lower lobe. Multiple pulmonary nodular lesions consistent with metastatic foci are noted, better seen on recent CT. Heart is upper normal in size with pulmonary vascularity within normal limits. There is no appreciable adenopathy. There are scattered lytic bone lesions, better seen on recent CT.  IMPRESSION: Left lower lobe airspace  consolidation. Evidence of metastatic disease with multiple pulmonary nodular lesions and bony metastases. These lesions are better seen on CT than radiography, particularly the bony metastatic lesions.   Electronically Signed   By: Lowella Grip III M.D.   On: 06/17/2014 08:57    ASSESSMENT/PLAN:  Generalized weakness - likely secondary to progression of metastatic soft tissue sarcoma; discharge home with Hospice recommendation Pneumonia - recently finished Levaquin course Failure to thrive in an adult - for discharge home with Hospice recommendation Protein calorie malnutrition, severe - albumin 2.5; continue supplementation Metastatic soft tissue sarcoma - has declined chemotherapy; for discharge home with Hospice recommendationAtrial fibrillation - continue Xarelto 20 mg by mouth daily and Cardizem 120 mg by mouth daily Pressure ulcer stage 2 on left hip - continue treatment and skin care Anxiety - mood is stable; Xanax was recently decreased to 0.5 mg 1 tab PO Q D PRN Depression - continue Lexapro 20 mg by mouth daily BPH - continue Proscar 5 mg by mouth daily Thrombocytopenia - platelet 118 Diarrhea - stable; continue Imodium 1 mg/5 mL give 5 mL by mouth 4 times a day when necessary    I have filled out patient's discharge paperwork and written prescriptions.    Total discharge time: Less than 30 minutes   Discharge time involved coordination of the discharge process with Education officer, museum, nursing staff and therapy department.      [facility], NP [employer] [phone]

## 2014-07-06 ENCOUNTER — Telehealth: Payer: Self-pay | Admitting: *Deleted

## 2014-07-06 ENCOUNTER — Other Ambulatory Visit: Payer: Self-pay | Admitting: *Deleted

## 2014-07-06 NOTE — Telephone Encounter (Signed)
PT.'S SON WANTED DR.SHERRILL TO KNOW AND TO CANCEL PT.'S 07/09/14 APPOINTMENT. POF TO SCHEDULER.

## 2014-07-06 NOTE — Telephone Encounter (Signed)
Oncology Nurse Navigator Documentation  Oncology Nurse Navigator Flowsheets 07/06/2014  Navigator Encounter Type Telephone  Treatment Phase End of life care  Time Spent with Patient 5  Daughter, [name] called to report Shannon Kirby died last night at 11:38 at the facility. Had not been able to get home yet. Very thankful for all the wonderful care and compassion from Dr. Benay Spice and staff.

## 2014-07-09 ENCOUNTER — Ambulatory Visit: Payer: Commercial Managed Care - HMO | Admitting: Oncology

## 2014-07-23 DEATH — deceased

## 2015-08-25 IMAGING — US US BIOPSY
1 series · 13 of 17 positions shown · non-contrast
Comparison: none

CLINICAL DATA: 83-year-old with concern for metastatic disease and
innumerable liver lesions. Tissue diagnosis is needed.

[Series 1: us biopsy · 0.24mm/px · 13 of 17 slices shown]
[im 1/17]
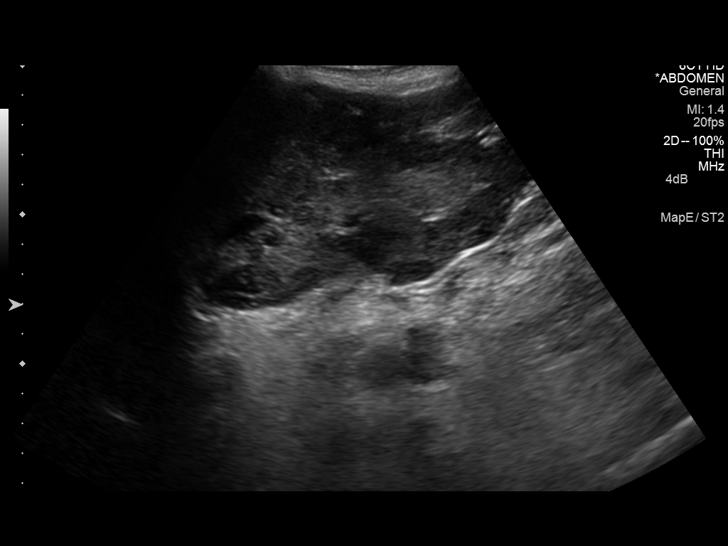
[im 2/17]
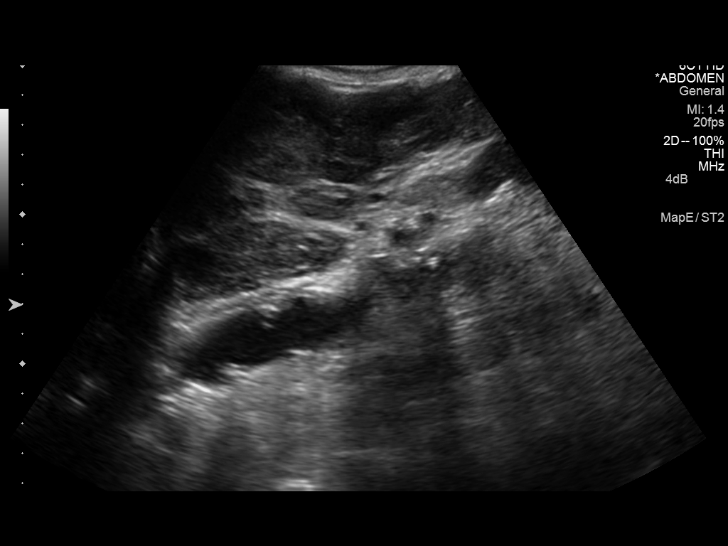
[im 4/17]
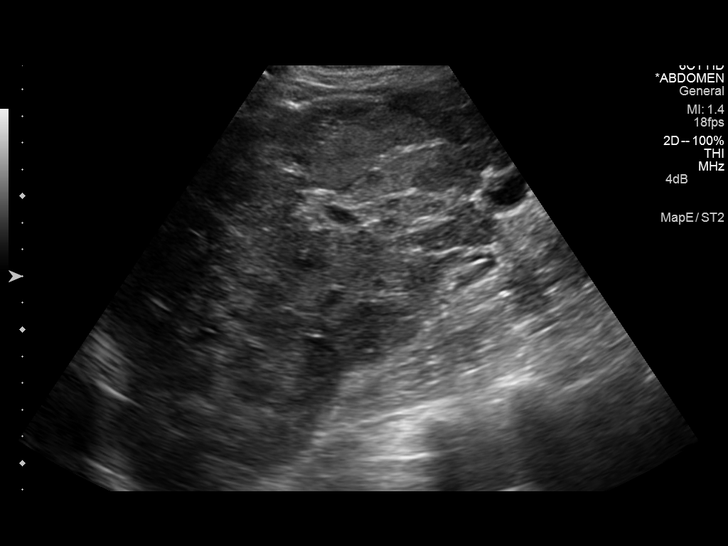
[im 5/17]
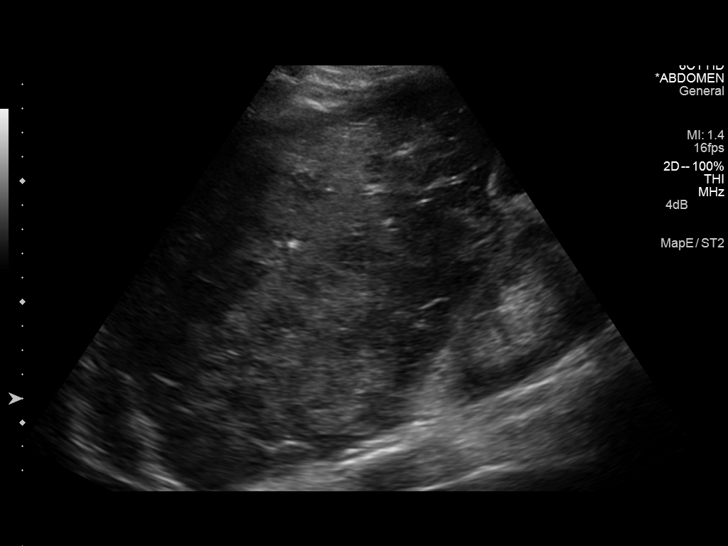
[im 6/17]
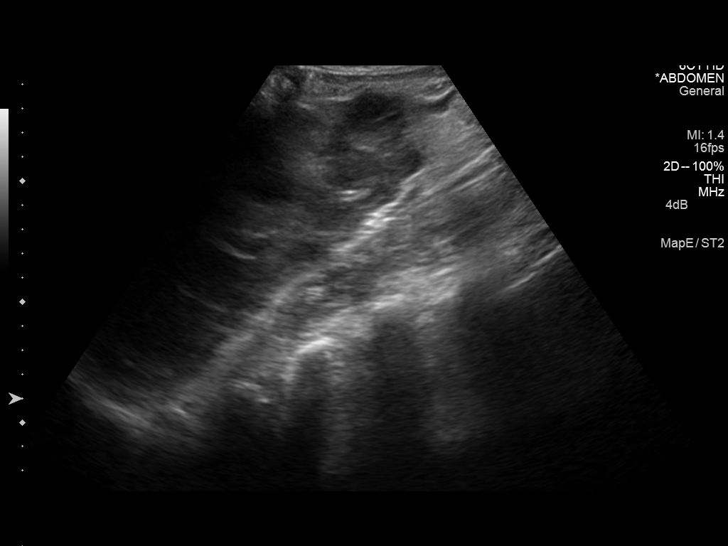
[im 8/17]
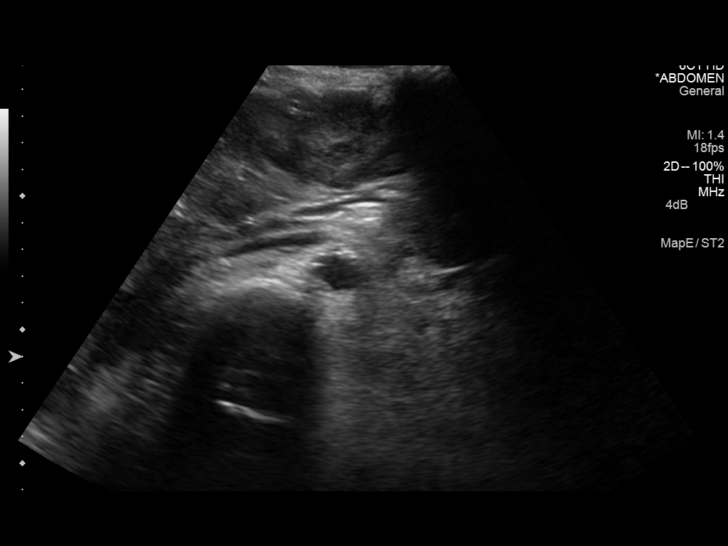
[im 9/17]
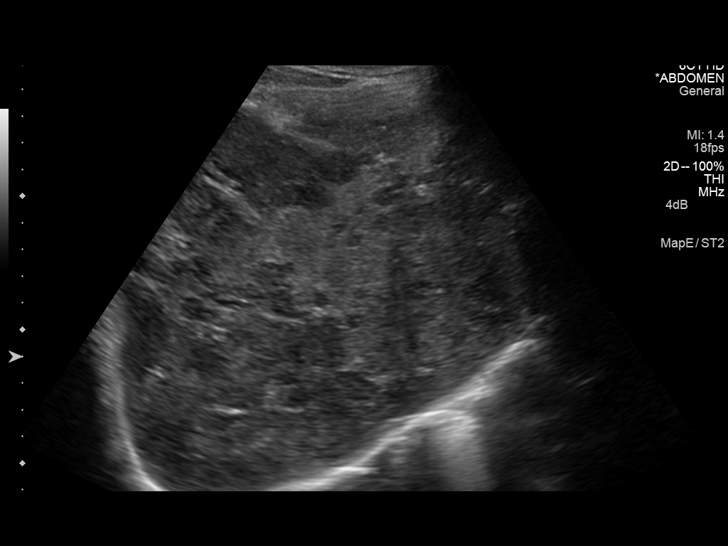
[im 10/17]
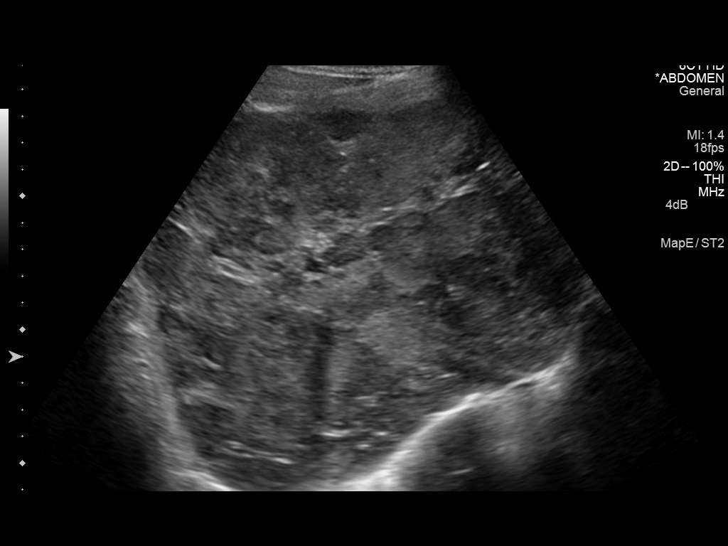
[im 12/17]
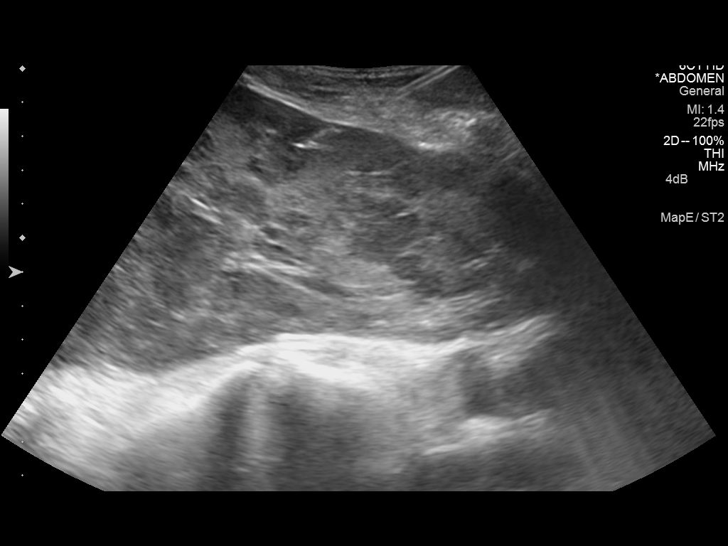
[im 13/17]
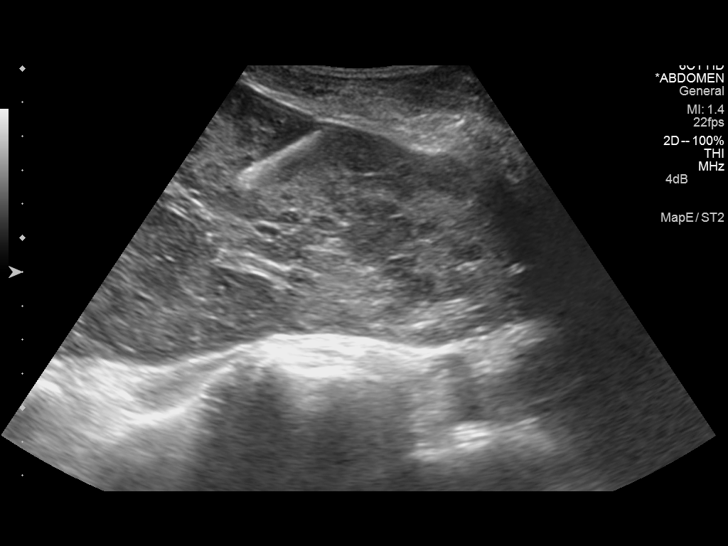
[im 14/17]
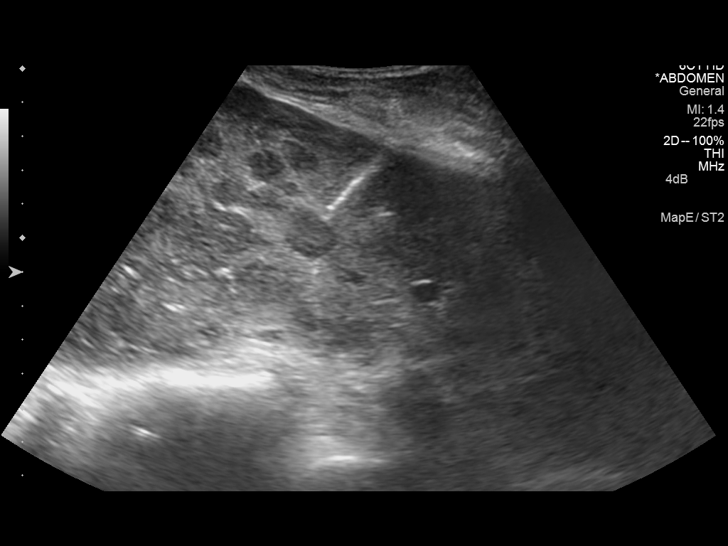
[im 16/17]
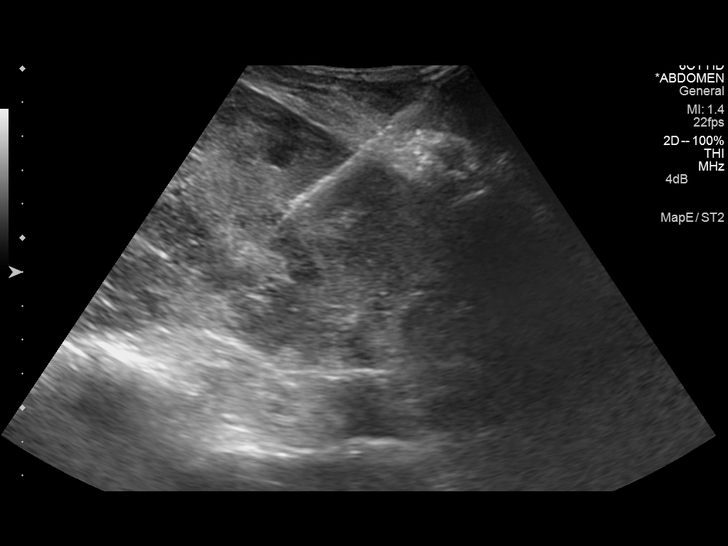
[im 17/17]
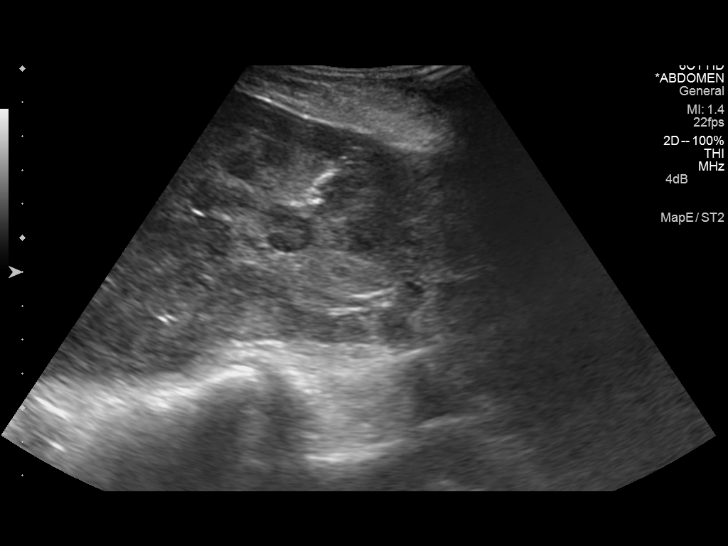

[13 of 17 positions shown; findings below may reference images not displayed]

EXAM:
ULTRASOUND-GUIDED LIVER LESION BIOPSY

FLUOROSCOPY TIME:  None

MEDICATIONS:
0.5 mg versed, 12.5 mcg fentanyl. A radiology nurse monitored the
patient for moderate sedation.

ANESTHESIA/SEDATION:
Moderate sedation time: 14 minutes

PROCEDURE:
The procedure was explained to the patient and son. The risks and
benefits of the procedure were discussed and the patient's questions
were addressed. Informed consent was obtained.

Patient was evaluated with ultrasound. Innumerable hypoechoic
lesions were identified in the liver. The right side of the abdomen
was prepped with Betadine and a sterile field was created. Using
ultrasound guidance, 17 gauge needle was directed into the right
hepatic lobe and an 18 gauge core biopsy was obtained in the region
of 2 small adjacent lesions. Needle was repositioned in a larger
hepatic lesion. Two additional core biopsies were obtained of this
larger lesion with the 18 gauge device. Specimens were placed in
formalin. 17 gauge needle was removed without complication. Bandage
placed over the puncture site.
FINDINGS: Innumerable hypoechoic lesions throughout the liver. Needle position
was confirmed within liver lesions in the right hepatic lobe. No
significant bleeding following the core biopsies.

Estimated blood loss: Minimal

COMPLICATIONS:
None
IMPRESSION: Ultrasound-guided core biopsies of right hepatic lesions.
